# Patient Record
Sex: Male | Born: 1995 | Race: White | Hispanic: No | Marital: Single | Smoking: Current every day smoker
Health system: Southern US, Community
[De-identification: ages and names within clinical notes are randomized; demographics above are authoritative.]

## PROBLEM LIST (undated history)

## (undated) ENCOUNTER — Emergency Department: Discharge status: Absent without leave | Payer: No Typology Code available for payment source

## (undated) DIAGNOSIS — F909 Attention-deficit hyperactivity disorder, unspecified type: Secondary | ICD-10-CM

## (undated) DIAGNOSIS — B182 Chronic viral hepatitis C: Secondary | ICD-10-CM

## (undated) DIAGNOSIS — B1920 Unspecified viral hepatitis C without hepatic coma: Secondary | ICD-10-CM

## (undated) HISTORY — PX: OTHER SURGICAL HISTORY: SHX169

## (undated) HISTORY — PX: HUMERUS FRACTURE SURGERY: SHX670

---
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
## 2014-10-10 ENCOUNTER — Emergency Department: Payer: Self-pay | Admitting: Emergency Medicine

## 2014-10-20 ENCOUNTER — Emergency Department: Payer: Self-pay | Admitting: Internal Medicine

## 2014-12-02 ENCOUNTER — Emergency Department: Payer: Self-pay | Admitting: Emergency Medicine

## 2015-04-12 ENCOUNTER — Emergency Department
Admit: 2015-04-12 | Disposition: A | Discharge status: Home / Self Care | Payer: Self-pay | Admitting: Emergency Medicine

## 2015-07-09 ENCOUNTER — Emergency Department: Payer: Medicaid Other

## 2015-07-09 ENCOUNTER — Emergency Department
Admission: EM | Admit: 2015-07-09 | Discharge: 2015-07-09 | Disposition: A | Discharge status: Home / Self Care | Payer: Medicaid Other | Attending: Emergency Medicine | Admitting: Emergency Medicine

## 2015-07-09 DIAGNOSIS — Y998 Other external cause status: Secondary | ICD-10-CM | POA: Diagnosis not present

## 2015-07-09 DIAGNOSIS — M545 Low back pain, unspecified: Secondary | ICD-10-CM

## 2015-07-09 DIAGNOSIS — Y9389 Activity, other specified: Secondary | ICD-10-CM | POA: Insufficient documentation

## 2015-07-09 DIAGNOSIS — S161XXA Strain of muscle, fascia and tendon at neck level, initial encounter: Secondary | ICD-10-CM | POA: Diagnosis not present

## 2015-07-09 DIAGNOSIS — Y92410 Unspecified street and highway as the place of occurrence of the external cause: Secondary | ICD-10-CM | POA: Diagnosis not present

## 2015-07-09 DIAGNOSIS — S3992XA Unspecified injury of lower back, initial encounter: Secondary | ICD-10-CM | POA: Insufficient documentation

## 2015-07-09 DIAGNOSIS — S199XXA Unspecified injury of neck, initial encounter: Secondary | ICD-10-CM | POA: Diagnosis present

## 2015-07-09 MED ORDER — NAPROXEN 500 MG PO TABS: 500.0000 mg | ORAL_TABLET | Freq: Two times a day (BID) | ORAL | Status: AC

## 2015-07-09 MED ORDER — NAPROXEN 500 MG PO TABS
500.0000 mg | ORAL_TABLET | Freq: Once | ORAL | Status: AC
  Administered 2015-07-09: 500 mg via ORAL
  Filled 2015-07-09: qty 1

## 2015-07-09 MED ORDER — DIAZEPAM 2 MG PO TABS
2.0000 mg | ORAL_TABLET | Freq: Once | ORAL | Status: AC
  Administered 2015-07-09: 2 mg via ORAL
  Filled 2015-07-09: qty 1

## 2015-07-09 MED ORDER — DIAZEPAM 2 MG PO TABS: 2.0000 mg | ORAL_TABLET | Freq: Three times a day (TID) | ORAL | Status: DC | PRN

## 2015-07-09 NOTE — ED Notes (Signed)
Rigid c collar applied in triage 
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
## 2015-07-09 NOTE — ED Notes (Signed)
Patient with no complaints at this time. Respirations even and unlabored. Skin warm/dry. Discharge instructions reviewed with patient at this time. Patient given opportunity to voice concerns/ask questions. Patient discharged at this time and left Emergency Department with steady gait.   

## 2015-07-10 NOTE — ED Notes (Signed)
Addendum - Patient's chart accessed due to patient stating that hospital had called and notified he had an infection in his blood stream.  This RN attempting to find correct information for patient.

## 2015-07-18 ENCOUNTER — Emergency Department
Admission: EM | Admit: 2015-07-18 | Discharge: 2015-07-19 | Disposition: A | Discharge status: Other Acute Inpatient Hospital | Payer: MEDICAID | Attending: Emergency Medicine | Admitting: Emergency Medicine

## 2015-07-18 ENCOUNTER — Encounter: Payer: Self-pay | Admitting: Emergency Medicine

## 2015-07-18 DIAGNOSIS — S60812A Abrasion of left wrist, initial encounter: Secondary | ICD-10-CM | POA: Insufficient documentation

## 2015-07-18 DIAGNOSIS — F23 Brief psychotic disorder: Secondary | ICD-10-CM

## 2015-07-18 DIAGNOSIS — F1510 Other stimulant abuse, uncomplicated: Secondary | ICD-10-CM | POA: Insufficient documentation

## 2015-07-18 DIAGNOSIS — F1110 Opioid abuse, uncomplicated: Secondary | ICD-10-CM | POA: Insufficient documentation

## 2015-07-18 DIAGNOSIS — X58XXXA Exposure to other specified factors, initial encounter: Secondary | ICD-10-CM | POA: Insufficient documentation

## 2015-07-18 DIAGNOSIS — Y9389 Activity, other specified: Secondary | ICD-10-CM | POA: Insufficient documentation

## 2015-07-18 DIAGNOSIS — F1210 Cannabis abuse, uncomplicated: Secondary | ICD-10-CM | POA: Insufficient documentation

## 2015-07-18 DIAGNOSIS — F29 Unspecified psychosis not due to a substance or known physiological condition: Secondary | ICD-10-CM | POA: Insufficient documentation

## 2015-07-18 DIAGNOSIS — Y9289 Other specified places as the place of occurrence of the external cause: Secondary | ICD-10-CM | POA: Insufficient documentation

## 2015-07-18 DIAGNOSIS — F1410 Cocaine abuse, uncomplicated: Secondary | ICD-10-CM | POA: Insufficient documentation

## 2015-07-18 DIAGNOSIS — F1910 Other psychoactive substance abuse, uncomplicated: Secondary | ICD-10-CM

## 2015-07-18 DIAGNOSIS — Y998 Other external cause status: Secondary | ICD-10-CM | POA: Insufficient documentation

## 2015-07-18 DIAGNOSIS — G8929 Other chronic pain: Secondary | ICD-10-CM

## 2015-07-18 HISTORY — DX: Attention-deficit hyperactivity disorder, unspecified type: F90.9

## 2015-07-18 LAB — URINE DRUG SCREEN, QUALITATIVE (ARMC ONLY)
Amphetamines, Ur Screen: NOT DETECTED
BENZODIAZEPINE, UR SCRN: NOT DETECTED
Barbiturates, Ur Screen: NOT DETECTED
CANNABINOID 50 NG, UR ~~LOC~~: POSITIVE — AB
Cocaine Metabolite,Ur ~~LOC~~: POSITIVE — AB
MDMA (ECSTASY) UR SCREEN: NOT DETECTED
METHADONE SCREEN, URINE: NOT DETECTED
OPIATE, UR SCREEN: POSITIVE — AB
PHENCYCLIDINE (PCP) UR S: NOT DETECTED
Tricyclic, Ur Screen: POSITIVE — AB

## 2015-07-18 LAB — CBC
HCT: 39.7 % — ABNORMAL LOW (ref 40.0–52.0)
HEMOGLOBIN: 13.5 g/dL (ref 13.0–18.0)
MCH: 30.5 pg (ref 26.0–34.0)
MCHC: 34 g/dL (ref 32.0–36.0)
MCV: 89.6 fL (ref 80.0–100.0)
PLATELETS: 182 10*3/uL (ref 150–440)
RBC: 4.43 MIL/uL (ref 4.40–5.90)
RDW: 13.2 % (ref 11.5–14.5)
WBC: 12.4 10*3/uL — ABNORMAL HIGH (ref 3.8–10.6)

## 2015-07-18 LAB — COMPREHENSIVE METABOLIC PANEL
ALK PHOS: 67 U/L (ref 38–126)
ALT: 17 U/L (ref 17–63)
AST: 22 U/L (ref 15–41)
Albumin: 4.4 g/dL (ref 3.5–5.0)
Anion gap: 8 (ref 5–15)
BILIRUBIN TOTAL: 0.8 mg/dL (ref 0.3–1.2)
BUN: 10 mg/dL (ref 6–20)
CALCIUM: 9.1 mg/dL (ref 8.9–10.3)
CHLORIDE: 107 mmol/L (ref 101–111)
CO2: 26 mmol/L (ref 22–32)
Creatinine, Ser: 1.05 mg/dL (ref 0.61–1.24)
GFR calc Af Amer: >60 mL/min (ref >60)
Glucose, Bld: 86 mg/dL (ref 65–99)
POTASSIUM: 4.2 mmol/L (ref 3.5–5.1)
SODIUM: 141 mmol/L (ref 135–145)
Total Protein: 6.8 g/dL (ref 6.5–8.1)

## 2015-07-18 LAB — ETHANOL

## 2015-07-18 LAB — SALICYLATE LEVEL: Salicylate Lvl: <4 mg/dL (ref 2.8–30.0)

## 2015-07-18 LAB — ACETAMINOPHEN LEVEL

## 2015-07-18 NOTE — ED Provider Notes (Addendum)
[facility] Emergency Department Provider Note     Time seen: ----------------------------------------- 6:31 PM on 07/18/2015 -----------------------------------------    I have reviewed the triage vital signs and the nursing notes. Level V caveat: Review of systems and history can't be obtained due to bizarre behavior.  HISTORY  Chief Complaint Psychiatric Evaluation    HPI Oscar Munoz is a 19 y.o. male who presents the ER being brought by in voluntary commitment for bizarre and psychotic behavior from the family. Patient is non-communicative at this point, review review of systems and history can't be obtained from patient.According to IVC. Report patient lives with his and several small children. He's been doing illegal drugs, has been eating at the trashcan, not sleeping and is not bathing. The report is that is running and hiding under the porch because he thinks someone is after him, talking to people who were not there.   No past medical history on file.  There are no active problems to display for this patient.   No past surgical history on file.  Allergies Review of patient's allergies indicates not on file.  Social History History  Substance Use Topics  . Smoking status: Not on file  . Smokeless tobacco: Not on file  . Alcohol Use: Not on file    Review of Systems 10-point ROS otherwise negative.  ____________________________________________   PHYSICAL EXAM:  VITAL SIGNS: ED Triage Vitals  Enc Vitals Group     BP 07/18/15 1644 115/61 mmHg     Pulse Rate 07/18/15 1644 76     Resp 07/18/15 1644 18     Temp 07/18/15 1644 98.5 F (36.9 C)     Temp Source 07/18/15 1644 Oral     SpO2 07/18/15 1644 99 %     Weight 07/18/15 1644 155 lb (70.308 kg)     Height 07/18/15 1644 6\' 3"  (1.905 m)     Head Cir --      Peak Flow --      Pain Score --      Pain Loc --      Pain Edu? --      Excl. in GC? --     Constitutional: Alert  , no acute distress Eyes: Conjunctivae are normal. ENT   Head: Normocephalic and atraumatic.   Nose: No congestion/rhinnorhea.   Mouth/Throat: Mucous membranes are moist.   Neck: No stridor. Cardiovascular: Normal rate, regular rhythm.  Respiratory: Normal respiratory effort without tachypnea nor retractions. Gastrointestinal: Soft and nontender. No distention.  Musculoskeletal: Nontender with normal range of motion in all extremities.  Neurologic:  No gross focal neurologic deficits are appreciated. No gait instability. Skin:  There is an abrasion noted to his left wrist, superficial laceration as well Psychiatric: Patient will not respond to questioning.  ____________________________________________  ED COURSE:  Pertinent labs & imaging results that were available during my care of the patient were reviewed by me and considered in my medical decision making (see chart for details). Patient likely with acute psychosis, we'll IVC. ____________________________________________    LABS (pertinent positives/negatives)  Labs Reviewed  ACETAMINOPHEN LEVEL - Abnormal; Notable for the following:    Acetaminophen (Tylenol), Serum <10 (*)    All other components within normal limits  CBC - Abnormal; Notable for the following:    WBC 12.4 (*)    HCT 39.7 (*)    All other components within normal limits  URINE DRUG SCREEN, QUALITATIVE ([facility] ONLY) - Abnormal; Notable for the following:  Tricyclic, Ur Screen POSITIVE (*)    Cocaine Metabolite,Ur [place] POSITIVE (*)    Opiate, Ur Screen POSITIVE (*)    Cannabinoid 50 Ng, Ur [place] POSITIVE (*)    All other components within normal limits  COMPREHENSIVE METABOLIC PANEL  ETHANOL  SALICYLATE LEVEL   ____________________________________________  FINAL ASSESSMENT AND PLAN  Acute psychosis, polysubstance abuse  Plan: Patient with labs and imaging as dictated above. Will continue under involuntary commitment, will consult  psychiatry for admission. Currently his vital signs are stable, labs reveal polysubstance abuse as well   Emily Filbert, MD   Emily Filbert, MD 07/18/15 1835  Emily Filbert, MD 07/18/15 [phone]

## 2015-07-18 NOTE — ED Notes (Signed)
BEHAVIORAL HEALTH ROUNDING Patient sleeping: No. Patient alert and oriented: yes Behavior appropriate: Yes.  ; If no, describe:  Nutrition and fluids offered: Yes  Toileting and hygiene offered: Yes  Sitter present: yes Law enforcement present: Yes  
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
## 2015-07-18 NOTE — ED Notes (Signed)
Patient to ED via [facility] under IVC with report of bizarre/psychotic type behaviors from family.

## 2015-07-18 NOTE — ED Notes (Addendum)
BEHAVIORAL HEALTH ROUNDING Patient sleeping: yes Patient alert and oriented: n/a Behavior appropriate: Yes.  ; If no, describe:  Nutrition and fluids offered: Yes  Toileting and hygiene offered: Yes  Sitter present: yes Law enforcement present: Yes

## 2015-07-18 NOTE — ED Notes (Signed)

## 2015-07-19 ENCOUNTER — Inpatient Hospital Stay
Admission: EM | Admit: 2015-07-19 | Discharge: 2015-07-22 | DRG: 897 | Disposition: A | Discharge status: Other Acute Inpatient Hospital | Payer: MEDICAID | Attending: Psychiatry | Admitting: Psychiatry

## 2015-07-19 DIAGNOSIS — R443 Hallucinations, unspecified: Secondary | ICD-10-CM | POA: Diagnosis present

## 2015-07-19 DIAGNOSIS — G8929 Other chronic pain: Secondary | ICD-10-CM | POA: Diagnosis present

## 2015-07-19 DIAGNOSIS — R45851 Suicidal ideations: Secondary | ICD-10-CM | POA: Diagnosis present

## 2015-07-19 DIAGNOSIS — F22 Delusional disorders: Secondary | ICD-10-CM | POA: Diagnosis present

## 2015-07-19 DIAGNOSIS — F419 Anxiety disorder, unspecified: Secondary | ICD-10-CM | POA: Diagnosis present

## 2015-07-19 DIAGNOSIS — G4700 Insomnia, unspecified: Secondary | ICD-10-CM | POA: Diagnosis present

## 2015-07-19 DIAGNOSIS — F19959 Other psychoactive substance use, unspecified with psychoactive substance-induced psychotic disorder, unspecified: Secondary | ICD-10-CM | POA: Diagnosis present

## 2015-07-19 DIAGNOSIS — F1220 Cannabis dependence, uncomplicated: Secondary | ICD-10-CM | POA: Diagnosis present

## 2015-07-19 DIAGNOSIS — F29 Unspecified psychosis not due to a substance or known physiological condition: Secondary | ICD-10-CM

## 2015-07-19 DIAGNOSIS — Z981 Arthrodesis status: Secondary | ICD-10-CM

## 2015-07-19 DIAGNOSIS — F1123 Opioid dependence with withdrawal: Secondary | ICD-10-CM | POA: Diagnosis present

## 2015-07-19 DIAGNOSIS — F1920 Other psychoactive substance dependence, uncomplicated: Secondary | ICD-10-CM

## 2015-07-19 DIAGNOSIS — F17200 Nicotine dependence, unspecified, uncomplicated: Secondary | ICD-10-CM | POA: Diagnosis present

## 2015-07-19 DIAGNOSIS — Z79899 Other long term (current) drug therapy: Secondary | ICD-10-CM

## 2015-07-19 DIAGNOSIS — F1120 Opioid dependence, uncomplicated: Secondary | ICD-10-CM | POA: Diagnosis present

## 2015-07-19 DIAGNOSIS — F19951 Other psychoactive substance use, unspecified with psychoactive substance-induced psychotic disorder with hallucinations: Secondary | ICD-10-CM | POA: Diagnosis not present

## 2015-07-19 DIAGNOSIS — F1420 Cocaine dependence, uncomplicated: Secondary | ICD-10-CM | POA: Diagnosis present

## 2015-07-19 DIAGNOSIS — F17210 Nicotine dependence, cigarettes, uncomplicated: Secondary | ICD-10-CM | POA: Diagnosis present

## 2015-07-19 DIAGNOSIS — Z818 Family history of other mental and behavioral disorders: Secondary | ICD-10-CM

## 2015-07-19 LAB — LIPID PANEL
Cholesterol: 96 mg/dL (ref 0–200)
HDL: 40 mg/dL — AB (ref >40)
LDL Cholesterol: 38 mg/dL (ref 0–99)
Total CHOL/HDL Ratio: 2.4 RATIO
Triglycerides: 88 mg/dL (ref <150)
VLDL: 18 mg/dL (ref 0–40)

## 2015-07-19 LAB — TSH: TSH: 0.315 u[IU]/mL — ABNORMAL LOW (ref 0.350–4.500)

## 2015-07-19 MED ORDER — MAGNESIUM HYDROXIDE 400 MG/5ML PO SUSP: 30.0000 mL | Freq: Every day | ORAL | Status: DC | PRN

## 2015-07-19 MED ORDER — BUPRENORPHINE HCL 2 MG SL SUBL
4.0000 mg | SUBLINGUAL_TABLET | Freq: Two times a day (BID) | SUBLINGUAL | Status: AC
  Administered 2015-07-19 – 2015-07-20 (×3): 4 mg via SUBLINGUAL
  Filled 2015-07-19 (×4): qty 2

## 2015-07-19 MED ORDER — ALUM & MAG HYDROXIDE-SIMETH 200-200-20 MG/5ML PO SUSP
30.0000 mL | ORAL | Status: DC | PRN
Start: 2015-07-19 — End: 2015-07-22

## 2015-07-19 MED ORDER — BUPRENORPHINE HCL 2 MG SL SUBL
4.0000 mg | SUBLINGUAL_TABLET | SUBLINGUAL | Status: AC
  Administered 2015-07-19: 4 mg via SUBLINGUAL
  Filled 2015-07-19: qty 2

## 2015-07-19 MED ORDER — NICOTINE 10 MG IN INHA
RESPIRATORY_TRACT | Status: AC
  Filled 2015-07-19: qty 36

## 2015-07-19 MED ORDER — NICOTINE 10 MG IN INHA
1.0000 | RESPIRATORY_TRACT | Status: DC | PRN
  Administered 2015-07-19: 1 via RESPIRATORY_TRACT

## 2015-07-19 MED ORDER — ACETAMINOPHEN 325 MG PO TABS
650.0000 mg | ORAL_TABLET | Freq: Four times a day (QID) | ORAL | Status: DC | PRN
  Administered 2015-07-22: 650 mg via ORAL
  Filled 2015-07-19: qty 2

## 2015-07-19 NOTE — ED Notes (Signed)
Patient report given to Amy, RN, patient going to room 313. Awaiting TTS.

## 2015-07-19 NOTE — Tx Team (Signed)
Initial Interdisciplinary Treatment Plan   PATIENT STRESSORS: Financial difficulties Substance abuse   PATIENT STRENGTHS: Capable of independent living Supportive family/friends   PROBLEM LIST: Problem List/Patient Goals Date to be addressed Date deferred Reason deferred Estimated date of resolution  Substance abuse      hallucinations                                                 DISCHARGE CRITERIA:  Verbal commitment to aftercare and medication compliance Withdrawal symptoms are absent or subacute and managed without 24-hour nursing intervention  PRELIMINARY DISCHARGE PLAN: Attend 12-step recovery group Return to previous living arrangement  PATIENT/FAMIILY INVOLVEMENT: This treatment plan has been presented to and reviewed with the patient, Oscar Munoz, and/or family member. The patient and family have been given the opportunity to ask questions and make suggestions.  Oscar Munoz [name] 07/19/2015, 1:26 PM

## 2015-07-19 NOTE — Progress Notes (Signed)
Recreation Therapy Notes  Date: 07.25.16 Time: 3:00 pm Location: Craft Room  Group Topic: Self-expression  Goal Area(s) Addresses:  Patient will effectively use art as a means of self-expression. Patient will recognize positive benefit of self-expression. Patient will be able to identify one emotion experienced during group session. Patient will identify use of art/self-expression as a coping skill.  Behavioral Response: Attentive  Intervention: Two Faces of Me  Activity: Patients were given a blank face worksheet and instructed to draw a line down the middle. On one side they were instructed to draw or write how they felt when they were admitted to the hospital. On the other side they were instructed to draw or write how they want to feel when they are d/c from the hospital.  Education: LRT educated patients on different forms of self-expression.  Education Outcome: In group clarification offered  Clinical Observations/Feedback: Patient drew two faces. Patient did not contribute to group discussion.  Jacquelynn Cree, LRT/CTRS 07/19/2015 4:24 PM

## 2015-07-19 NOTE — ED Notes (Signed)
ED BHU PLACEMENT JUSTIFICATION Is the patient under IVC or is there intent for IVC: Yes.   Is the patient medically cleared: Yes.   Is there vacancy in the ED BHU: Yes.   Is the population mix appropriate for patient: Yes.   Is the patient awaiting placement in inpatient or outpatient setting: No. Has the patient had a psychiatric consult: No. Survey of unit performed for contraband, proper placement and condition of furniture, tampering with fixtures in bathroom, shower, and each patient room: Yes.  ; Findings:  APPEARANCE/BEHAVIOR adequate rapport can be established NEURO ASSESSMENT Orientation: person Hallucinations: No.None noted (Hallucinations) Speech: Normal Gait: normal RESPIRATORY ASSESSMENT Breathing Pattern: Normal CARDIOVASCULAR ASSESSMENT regular rate and rhythm, S1, S2 normal, no murmur, click, rub or gallop and No complications noted GASTROINTESTINAL ASSESSMENT No complaints reported EXTREMITIES no deformities PLAN OF CARE Provide calm/safe environment. Vital signs assessed twice daily. ED BHU Assessment once each 12-hour shift. Collaborate with intake RN daily or as condition indicates. Assure the ED provider has rounded once each shift. Provide and encourage hygiene. Provide redirection as needed. Assess for escalating behavior; address immediately and inform ED provider.  Assess family dynamic and appropriateness for visitation as needed: Yes.  ; If necessary, describe findings:  Educate the patient/family about BHU procedures/visitation: Yes.  ; If necessary, describe findings:

## 2015-07-19 NOTE — ED Notes (Signed)

## 2015-07-19 NOTE — ED Notes (Signed)
BEHAVIORAL HEALTH ROUNDING Patient sleeping: Yes.   Patient alert and oriented: not applicable Behavior appropriate: Yes.    Nutrition and fluids offered: No Toileting and hygiene offered: No Sitter present: q15 minute observations and security camera monitoring Law enforcement present: Yes [employer] 

## 2015-07-19 NOTE — BHH Group Notes (Signed)
[facility] LCSW Group Therapy  07/19/2015 3:46 PM  Type of Therapy:  Group Therapy  Participation Level:  Did Not Attend   Lulu Riding, MSW, LCSWA 07/19/2015, 3:46 PM
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
## 2015-07-19 NOTE — ED Notes (Signed)
ENVIRONMENTAL ASSESSMENT  Potentially harmful objects out of patient reach: Yes.  Personal belongings secured: Yes.  Patient dressed in hospital provided attire only: Yes.  Plastic bags out of patient reach: Yes.  Patient care equipment (cords, cables, call bells, lines, and drains) shortened, removed, or accounted for: Yes.  Equipment and supplies removed from bottom of stretcher: Yes.  Potentially toxic materials out of patient reach: Yes.  Sharps container removed or out of patient reach: Yes.   BEHAVIORAL HEALTH ROUNDING Patient sleeping: Yes.   Patient alert and oriented: not applicable SLEEPING Behavior appropriate: Yes.  ; If no, describe: SLEEPING Nutrition and fluids offered: No SLEEPING Toileting and hygiene offered: NoSLEEPING Sitter present: not applicable Law enforcement present: Yes [employer] 

## 2015-07-19 NOTE — ED Provider Notes (Signed)
-----------------------------------------   7:24 AM on 07/19/2015 -----------------------------------------   BP 101/52 mmHg  Pulse 61  Temp(Src) 97.4 F (36.3 C) (Oral)  Resp 18  Ht  (1.905 m)  Wt 155 lb (70.308 kg)  BMI 19.37 kg/m2  SpO2 99%  No acute events overnight. Vitals reviewed. Patient remains medically cleared.  Disposition is pending per Psychiatry/Behavioral Medicine team recommendations.   Jene Every, MD 07/19/15 [phone]

## 2015-07-19 NOTE — BHH Counselor (Signed)
Per Dr. Kumar, pt meets criteria for inpatient admission. 

## 2015-07-19 NOTE — ED Notes (Signed)
BEHAVIORAL HEALTH ROUNDING Patient sleeping: No. Patient alert and oriented: yes Behavior appropriate: Yes.  ; If no, describe:  Nutrition and fluids offered: Yes  Toileting and hygiene offered: Yes  Sitter present: yes 15 minute safety rounds completed Law enforcement present: Yes [employer]  

## 2015-07-19 NOTE — ED Notes (Signed)
BEHAVIORAL HEALTH ROUNDING Patient sleeping: Yes.   Patient alert and oriented: not applicable SLEEPING Behavior appropriate: Yes.  ; If no, describe: SLEEPING Nutrition and fluids offered: No SLEEPING Toileting and hygiene offered: NoSLEEPING Sitter present: not applicable Law enforcement present: Yes [employer] 

## 2015-07-19 NOTE — ED Notes (Signed)
BEHAVIORAL HEALTH ROUNDING Patient sleeping: Yes.   Patient alert and oriented: not applicable Behavior appropriate: Yes.  ; If no, describe:  Nutrition and fluids offered: Yes  Toileting and hygiene offered: Yes  Sitter present: yes 15 minute rounds being completed Law enforcement present: Yes [employer] 
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
## 2015-07-19 NOTE — ED Notes (Signed)
Patient assigned to appropriate care area. Patient oriented to unit/care area: Informed that, for their safety, care areas are designed for safety and monitored by security cameras at all times; and visiting hours explained to patient. Patient verbalizes understanding, and verbal contract for safety obtained.  Pt brought into ED BHU via sally port escorted by myself and [employer] Officer. Pt wanded with metal detector for safety by [employer] officer. Patient oriented to unit/care area: Pt informed of unit policies and procedures.  Informed that, for their safety, care areas are designed for safety and monitored by security cameras at all times; and visiting hours explained to patient. Patient verbalizes understanding, and verbal contract for safety obtained.Pt shown to their room 7.  BEHAVIORAL HEALTH ROUNDING Patient sleeping: No. Patient alert and oriented: yes Behavior appropriate: Yes.   Nutrition and fluids offered: Yes  Toileting and hygiene offered: Yes  Sitter present: q15 min observations and security camera monitoring Law enforcement present: Yes [employer]  ENVIRONMENTAL ASSESSMENT Potentially harmful objects out of patient reach: Yes.   Personal belongings secured: Yes.   Patient dressed in hospital provided attire only: Yes.   Plastic bags out of patient reach: Yes.   Patient care equipment removed: Yes.   Equipment and supplies removed: Yes.   Potentially toxic materials out of patient reach: Yes.   Sharps container removed or out of patient reach: Yes.     ED BHU PLACEMENT JUSTIFICATION Is the patient under IVC or is there intent for IVC: Yes.    Is IVC current? Yes Is the patient medically cleared: Yes.   Is there vacancy in the ED BHU: Yes.   Is the population mix appropriate for patient: Yes.   Is the patient awaiting placement in inpatient or outpatient setting: unknown at this time pt refused to talk to TTS since his admission.   Has the patient had a psychiatric consult: No.    Survey of unit performed for contraband, proper placement and condition of furniture, tampering with fixtures in bathroom, shower, and each patient room: Yes.  ; Findings: none APPEARANCE/BEHAVIOR Cooperative,calm NEURO ASSESSMENT Orientation: Oriented to person and place Hallucinations: none noted at this time Speech: Normal Gait: normal RESPIRATORY ASSESSMENT Breathing Pattern-regular, no respiratory distress noted CARDIOVASCULAR ASSESSMENT Skin color appropriate for age and race GASTROINTESTINAL ASSESSMENT no GI distress noted EXTREMITIES Moves all extremities, no distress noted PLAN OF CARE Provide calm/safe environment. Vital signs assessed twice daily. ED BHU Assessment once each 12-hour shift. Collaborate with intake RN daily or as condition indicates. Assure the ED provider has rounded once each shift. Provide and encourage hygiene. Provide redirection as needed. Assess for escalating behavior; address immediately and inform ED provider.  Assess family dynamic and appropriateness for visitation as needed: Yes.  Educate the patient/family about BHU procedures/visitation: Yes.

## 2015-07-20 ENCOUNTER — Encounter: Payer: Self-pay | Admitting: Psychiatry

## 2015-07-20 DIAGNOSIS — F1220 Cannabis dependence, uncomplicated: Secondary | ICD-10-CM | POA: Diagnosis present

## 2015-07-20 DIAGNOSIS — F17200 Nicotine dependence, unspecified, uncomplicated: Secondary | ICD-10-CM | POA: Diagnosis present

## 2015-07-20 DIAGNOSIS — F1120 Opioid dependence, uncomplicated: Secondary | ICD-10-CM | POA: Diagnosis present

## 2015-07-20 DIAGNOSIS — F1420 Cocaine dependence, uncomplicated: Secondary | ICD-10-CM | POA: Diagnosis present

## 2015-07-20 DIAGNOSIS — F19951 Other psychoactive substance use, unspecified with psychoactive substance-induced psychotic disorder with hallucinations: Secondary | ICD-10-CM

## 2015-07-20 LAB — HEMOGLOBIN A1C: HEMOGLOBIN A1C: 5 % (ref 4.0–6.0)

## 2015-07-20 MED ORDER — IBUPROFEN 800 MG PO TABS
800.0000 mg | ORAL_TABLET | Freq: Four times a day (QID) | ORAL | Status: DC | PRN
  Administered 2015-07-21: 800 mg via ORAL
  Filled 2015-07-20: qty 1

## 2015-07-20 MED ORDER — NICOTINE 10 MG IN INHA: 1.0000 | RESPIRATORY_TRACT | Status: DC | PRN

## 2015-07-20 MED ORDER — QUETIAPINE FUMARATE 100 MG PO TABS
100.0000 mg | ORAL_TABLET | Freq: Every day | ORAL | Status: DC
  Administered 2015-07-20 – 2015-07-21 (×2): 100 mg via ORAL
  Filled 2015-07-20 (×3): qty 1

## 2015-07-20 MED ORDER — TRAZODONE HCL 100 MG PO TABS
100.0000 mg | ORAL_TABLET | Freq: Every evening | ORAL | Status: DC | PRN
Start: 2015-07-20 — End: 2015-07-22
  Administered 2015-07-20 – 2015-07-21 (×2): 100 mg via ORAL
  Filled 2015-07-20 (×3): qty 1

## 2015-07-20 MED ORDER — QUETIAPINE FUMARATE 25 MG PO TABS
25.0000 mg | ORAL_TABLET | Freq: Three times a day (TID) | ORAL | Status: DC
  Administered 2015-07-20 – 2015-07-22 (×6): 25 mg via ORAL
  Filled 2015-07-20 (×7): qty 1

## 2015-07-20 MED ORDER — TRAZODONE HCL 100 MG PO TABS: 100.0000 mg | ORAL_TABLET | Freq: Every evening | ORAL | Status: DC | PRN

## 2015-07-20 MED ORDER — LOPERAMIDE HCL 2 MG PO CAPS
4.0000 mg | ORAL_CAPSULE | ORAL | Status: DC | PRN
  Administered 2015-07-22: 4 mg via ORAL
  Filled 2015-07-20 (×2): qty 2

## 2015-07-20 NOTE — Progress Notes (Signed)
D: Patient denies SI/HI/AVH. Patient affect and mood is  anxious.  Patient states he stays up for days on a regular basis. Patient did attend some groups. Patient visible on the milieu. No distress noted. A: Support and encouragement offered. Scheduled medications given to pt. Q 15 min checks continued for patient safety. R: Patient receptive. Patient remains safe on the unit.

## 2015-07-20 NOTE — Progress Notes (Signed)
Recreation Therapy Notes  Date: 07.26.16 Time: 3:00 pm Location: Craft Room  Group Topic: Coping Skills  Goal Area(s) Addresses:  Patient will verbalize an emotion experienced during group. Patient will verbalize benefit of using art as a coping skill.  Behavioral Response: Did not attend  Intervention: Coloring  Activity: Patients were given coloring sheets and instructed to color.   Education: LRT educated patients on benefit of using art as a Associate Professor.  Education Outcome: Patient did not attend group.  Clinical Observations/Feedback: Patient did not attend group.  Jacquelynn Cree, LRT/CTRS 07/20/2015 4:25 PM

## 2015-07-20 NOTE — Progress Notes (Signed)
Recreation Therapy Notes  INPATIENT RECREATION THERAPY ASSESSMENT  Patient Details Name: Shondale Quinley MRN: [id number] DOB: 01-13-1996 Today's Date: 07/20/2015  Patient Stressors: Family, Death  Coping Skills:   Arguments, Substance Abuse, Exercise, Talking, Music, Sports  Personal Challenges: Substance Abuse  Leisure Interests (2+):  Nature - Fishing, Individual - Other (Comment) (Hanging out with his girl, going out to clubs)  Awareness of Community Resources:  Yes  Community Resources:  Research scientist (physical sciences), Tree surgeon  Current Use: Yes  If no, Barriers?:    Patient Strengths:  Personality, loving  Patient Identified Areas of Improvement:  Spending money on drugs  Current Recreation Participation:  Working on cars  Patient Goal for Hospitalization:  To get out, get an anxiety medication, and stop using drugs  [place] of Residence:  [place] of Residence:  [place]   Current SI (including self-harm):  No  Current HI:  No  Consent to Intern Participation: N/A   Jacquelynn Cree, LRT/CTRS 07/20/2015, 12:50 PM

## 2015-07-20 NOTE — Plan of Care (Signed)
Problem: Ineffective individual coping Goal: STG: Pt will be able to identify effective and ineffective STG: Pt will be able to identify effective and ineffective coping patterns  Outcome: Not Progressing Pt unable to identify effective coping patterns. Goal: STG: Patient will remain free from self harm Outcome: Progressing No self harm reported or observed

## 2015-07-20 NOTE — [facility] Group Notes (Signed)
[facility] Group Notes:  (Nursing/MHT/Case Management/Adjunct)  Date:  07/20/2015  Time:  2:17 PM  Type of Therapy:  Psychoeducational Skills  Participation Level:  Did Not Attend      Oscar Munoz Oscar Munoz Arnelle Nale 07/20/2015, 2:17 PM 
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
## 2015-07-20 NOTE — Progress Notes (Signed)
D: Patient denies SI/HI/AVH.  Patient affect and mood are anxious.  Patient up for the majority of the night.  Patient states he stays up for days on a regular basis.  Patient did attend evening group. Patient visible on the milieu. No distress noted. A: Support and encouragement offered. Scheduled medications given to pt. Q 15 min checks continued for patient safety. R: Patient receptive. Patient remains safe on the unit.
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
## 2015-07-21 MED ORDER — BUPRENORPHINE HCL 2 MG SL SUBL
4.0000 mg | SUBLINGUAL_TABLET | Freq: Once | SUBLINGUAL | Status: AC
  Administered 2015-07-21: 4 mg via SUBLINGUAL
  Filled 2015-07-21: qty 2

## 2015-07-21 MED ORDER — NICOTINE 21 MG/24HR TD PT24
21.0000 mg | MEDICATED_PATCH | Freq: Every day | TRANSDERMAL | Status: DC
  Administered 2015-07-21 – 2015-07-22 (×2): 21 mg via TRANSDERMAL
  Filled 2015-07-21 (×2): qty 1

## 2015-07-21 NOTE — Progress Notes (Signed)
NUTRITION NOTE:   Oscar Munoz is a 19 y.o. male with Substance-induced psychotic disorder with hallucinations; using heroine, methadone, cocaine, marijuana  PMH:  Past Medical History  Diagnosis Date  . Attention deficit hyperactivity disorder (ADHD)     Diet Order:  Regular   Current Nutrition: pt eating 80-100% of meals  Anthropometrics:  Body mass index is 18.99 kg/(m^2).    Pt is at no nutrition risk due to diagnosis/current problem, BMI as above, eating >75% of meals trays, Regular diet order. If pt not receiving enough on meal trays, could add double portions but otherwise no nutrition intervention warranted at this time. Will sign off. Please re-consult RD if nutritional issues arise.   Romelle Starcher MS, RD, LDN [phone] Pager

## 2015-07-21 NOTE — Plan of Care (Signed)
Problem: Consults Goal: [facility] General Treatment Patient Education Outcome: Progressing Patient with sad affect. Cooperative with meds. Encouraged to attend therapy groups to learn and initiate coping skills for management of stressors and diagnosis.

## 2015-07-21 NOTE — [facility] Group Notes (Signed)
[facility] Group Notes:  (Nursing/MHT/Case Management/Adjunct)  Date:  07/21/2015  Time:  12:50 PM  Type of Therapy:  Psychoeducational Skills  Participation Level:  Did Not Attend   Oscar Munoz Oscar Munoz 07/21/2015, 12:50 PM 

## 2015-07-21 NOTE — Clinical Social Work Note (Signed)
CSW received call from [facility] and patient has bed available on 07/22/15 Thursday and will be sent IVC by sheriff.

## 2015-07-21 NOTE — Tx Team (Signed)
Interdisciplinary Treatment Plan Update (Adult)  Date:  07/21/2015 Time Reviewed:  1:33 PM  Progress in Treatment: Attending groups: No. Participating in groups:  No. Taking medication as prescribed:  Yes. Tolerating medication:  Yes. Family/Significant othe contact made:  No, will contact:  if patient provides consent Patient understands diagnosis:  Yes. Discussing patient identified problems/goals with staff:  Yes. Medical problems stabilized or resolved:  Yes. Denies suicidal/homicidal ideation: Yes. Issues/concerns per patient self-inventory:  No. Other:  New problem(s) identified: No, Describe:  none reported  Discharge Plan or Barriers: Patient is admitted for substance induced psychosis with IV heroinn use and referred IVC to [facility]. Will need community mental health and [facility] in [place].  Reason for Continuation of Hospitalization: Delusions  Other; describe paranoid pychosis  Comments:  Estimated length of stay: up to 2 days  New goal(s):  Review of initial/current patient goals per problem list:   See Care Plan  Attendees: Physician:  Kristine Linea, MD 7/27/20161:33 PM  Nursing:   Shelia Media, RN 7/27/20161:33 PM  Other:  Beryl Meager, LCSWA 7/27/20161:33 PM  Other:  Jake Shark, LCSW 7/27/20161:33 PM  Other:  Hershal Coria, LRT 7/27/20161:33 PM  Other:  7/27/20161:33 PM  Other:  7/27/20161:33 PM  Other:  7/27/20161:33 PM  Other:  7/27/20161:33 PM  Other:  7/27/20161:33 PM  Other:  7/27/20161:33 PM  Other:   7/27/20161:33 PM   Scribe for Treatment Team:   Lulu Riding, MSW, LCSWA  07/21/2015, 1:33 PM

## 2015-07-21 NOTE — Plan of Care (Signed)
Problem: [facility] Participation in Recreation Therapeutic Interventions Goal: STG-Patient will identify at least five coping skills for ** STG: Coping Skills - Within 4 treatment sessions, patient will verbalize at least 5 coping skills for substance abuse in each of 2 treatment sessions to decrease substance abuse post d/c.  Outcome: Not Progressing Treatment Session 1; Completed 0 out of 2: At approximately 12:30 pm, LRT met with patient in craft room. Patient refused treatment session. Patient requested for LRT to attempt treatment session tomorrow as patient had new medications and is sleepy. LRT will attempt treatment session tomorrow.  Leonette Monarch, LRT/CTRS 07.27.16 1:53 pm
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
## 2015-07-21 NOTE — Progress Notes (Signed)
D: Pt is awake and active in the milieu this evening. Pt mood is anxious and his affect is sad. Pt reports that he is feeling better, and hopes that he will be able to sleep better tonight. Apparently, he often stays up for days on end using drugs, although he is "productive" when doing so. Pt seems to have very little insight at this time.  A: Writer provided emotional support and administered mediciations as prescribed.  R: Pt is appropriate on the unit and is pleasant and cooperative with staff. Pt went to bed shortly after evening snack.
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
## 2015-07-21 NOTE — [facility] Group Notes (Signed)
[facility] LCSW Group Therapy  07/21/2015 4:20 PM  Type of Therapy:  Group Therapy  Participation Level:  Did Not Attend  Modes of Intervention:  Discussion, Education, Socialization and Support  Summary of Progress/Problems: Emotional Regulation: Patients will identify both negative and positive emotions. They will discuss emotions they have difficulty regulating and how they impact their lives. Patients will be asked to identify healthy coping skills to combat unhealthy reactions to negative emotions.     Enyah Moman L Corona Popovich MSW, LCSWA  07/21/2015, 4:20 PM  

## 2015-07-21 NOTE — Progress Notes (Signed)
Recreation Therapy Notes  Date: 07.27.16 Time: 3:00 pm Location: Craft Room  Group Topic: Self-esteem  Goal Area(s) Addresses:  Patient will identify positive attributes about self. Patient will identify at least one coping skill.  Behavioral Response: Did not attend  Intervention: All About Me  Activity: Patients were instructed to make an "All About Me" pamphlet with their life's motto, positive traits, coping skills, and their healthy support system.  Education: LRT educated patients on ways to increase their self-esteem.   Education Outcome: Patient did not attend group.  Clinical Observations/Feedback: Patient did not attend group.  Jacquelynn Cree, LRT/CTRS 07/21/2015 4:20 PM

## 2015-07-22 MED ORDER — BUPRENORPHINE HCL 2 MG SL SUBL
2.0000 mg | SUBLINGUAL_TABLET | Freq: Once | SUBLINGUAL | Status: AC
  Administered 2015-07-22: 2 mg via SUBLINGUAL
  Filled 2015-07-22: qty 1

## 2015-07-22 NOTE — Progress Notes (Signed)
Recreation Therapy Notes  INPATIENT RECREATION TR PLAN  Patient Details Name: Oscar Munoz MRN: [id number] DOB: July 19, 1996 Today's Date: 07/22/2015  Rec Therapy Plan Is patient appropriate for Therapeutic Recreation?: Yes Treatment times per week: At least 2 times a week TR Treatment/Interventions: 1:1 session, Group participation (Comment) (Appropriate participation in daily recreation therapy tx)  Discharge Criteria Pt will be discharged from therapy if:: Discharged Treatment plan/goals/alternatives discussed and agreed upon by:: Patient/family  Discharge Summary Short term goals set: See Care Plan Short term goals met: Other (Comment) (Not applicable) Progress toward goals comments: Groups attended Which groups?: Other (Comment) (Self-expression) Reason goals not met: Patient refused to work on goal Therapeutic equipment acquired: None Reason patient discharged from therapy: Discharge from hospital Pt/family agrees with progress & goals achieved: Yes Date patient discharged from therapy: 07/22/15   Leonette Monarch, LRT/CTRS 07/22/2015, 4:39 PM

## 2015-07-22 NOTE — [facility] Group Notes (Signed)
[facility] Group Notes:  (Nursing/MHT/Case Management/Adjunct)  Date:  07/22/2015  Time:  1:52 AM  Type of Therapy:  Psychoeducational Skills  Participation Level:  Active  Participation Quality:  Appropriate, Attentive and Sharing  Affect:  Appropriate  Cognitive:  Appropriate and Oriented  Insight:  Improving  Engagement in Group:  Distracting and Engaged  Modes of Intervention:  Discussion and Exploration  Summary of Progress/Problems:  Foy Guadalajara 07/22/2015, 1:52 AM

## 2015-07-22 NOTE — Progress Notes (Signed)
[facility] H&P Discharge Summary faxed to [facility] for hospital follow-up °

## 2015-07-22 NOTE — [facility] Group Notes (Signed)
[facility] LCSW Aftercare Discharge Planning Group Note  07/22/2015 8:53 AM  Participation Quality:  Resistant  Affect:  Anxious  Cognitive:  Alert  Insight:  Developing/Improving  Engagement in Group:  Developing/Improving  Modes of Intervention:  Discussion and Education  Summary of Progress/Problems:Patient came from hallway and sat in room for group this am his goal is to Leave asap.  Arrie Senate M 07/22/2015, 8:53 AM

## 2015-07-22 NOTE — Plan of Care (Signed)
Problem: Ineffective individual coping Goal: STG: Patient will remain free from self harm Outcome: Progressing No self harm.  Goal: STG-Increase in ability to manage activities of daily living Outcome: Progressing Pt able to complete ADL's independently.

## 2015-07-22 NOTE — [facility] Suicide Risk Assessment (Signed)
[facility] Discharge Suicide Risk Assessment   Demographic Factors:  Male, Adolescent or young adult, Caucasian, Low socioeconomic status and Unemployed  Total Time spent with patient: 30 minutes  Musculoskeletal: Strength & Muscle Tone: within normal limits Gait & Station: normal Patient leans: N/A  Psychiatric Specialty Exam: Physical Exam  Nursing note and vitals reviewed.   Review of Systems  Gastrointestinal: Positive for diarrhea.  All other systems reviewed and are negative.   Blood pressure 134/57, pulse 57, temperature 98 F (36.7 C), temperature source Oral, resp. rate 20, height  (1.88 m), weight 67.132 kg (148 lb), SpO2 99 %.Body mass index is 18.99 kg/(m^2).  General Appearance: Casual  Eye Contact::  Good  Speech:  Clear and Coherent409  Volume:  Normal  Mood:  Euthymic  Affect:  Appropriate  Thought Process:  Goal Directed  Orientation:  Full (Time, Place, and Person)  Thought Content:  WDL  Suicidal Thoughts:  No  Homicidal Thoughts:  No  Memory:  Immediate;   Fair Recent;   Fair Remote;   Fair  Judgement:  Impaired  Insight:  Shallow  Psychomotor Activity:  Normal  Concentration:  Fair  Recall:  [employer] of Knowledge:Fair  Language: Fair  Akathisia:  No  Handed:  Right  AIMS (if indicated):     Assets:  Communication Skills Desire for Improvement Physical Health Social Support  Sleep:  Number of Hours: 6.5  Cognition: WNL  ADL's:  Intact   Have you used any form of tobacco in the last 30 days? (Cigarettes, Smokeless Tobacco, Cigars, and/or Pipes): Yes  Has this patient used any form of tobacco in the last 30 days? (Cigarettes, Smokeless Tobacco, Cigars, and/or Pipes) Yes, A prescription for an FDA-approved tobacco cessation medication was offered at discharge and the patient refused  Mental Status Per Nursing Assessment::   On Admission:  Self-harm behaviors  Current Mental Status by Physician: NA  Loss Factors: Financial  problems/change in socioeconomic status  Historical Factors: Family history of mental illness or substance abuse  Risk Reduction Factors:   Sense of responsibility to family, Living with another person, especially a relative and Positive social support  Continued Clinical Symptoms:  Alcohol/Substance Abuse/Dependencies  Cognitive Features That Contribute To Risk:  None    Suicide Risk:  Minimal: No identifiable suicidal ideation.  Patients presenting with no risk factors but with morbid ruminations; may be classified as minimal risk based on the severity of the depressive symptoms  Principal Problem: Substance-induced psychotic disorder with hallucinations Discharge Diagnoses:  Patient Active Problem List   Diagnosis Date Noted  . Tobacco use disorder [Z72.0] 07/20/2015  . Cocaine use disorder, moderate, dependence [F14.20] 07/20/2015  . Cannabis use disorder, moderate, dependence [F12.20] 07/20/2015  . Opioid use disorder, severe, dependence [F11.20] 07/20/2015  . Chronic pain [G89.29] 07/19/2015  . 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 Diet:  regular. Other:  keep follow up appointments.  Is patient on multiple antipsychotic therapies at discharge:  No   Has Patient had three or more failed trials of antipsychotic monotherapy by history:  No  Recommended Plan for Multiple Antipsychotic Therapies: NA    Faryal Marxen 07/22/2015, 10:23 AM

## 2015-07-22 NOTE — Plan of Care (Signed)
Problem: [facility] Participation in Recreation Therapeutic Interventions Goal: STG-Patient will identify at least five coping skills for ** STG: Coping Skills - Within 4 treatment sessions, patient will verbalize at least 5 coping skills for substance abuse in each of 2 treatment sessions to decrease substance abuse post d/c.  Outcome: Not Applicable Date Met:  03/79/55 Treatment Session 2; Completed 0 out of 2: At approximately 8:55 am, LRT attempted treatment session. Patient refused. LRT will not continue to attempt treatment session as patient is planning to d/c today.  Leonette Monarch, LRT/CTRS 07.28.16 12:55 pm

## 2015-07-22 NOTE — [facility] Suicide Risk Assessment (Signed)
[facility] INPATIENT:  Family/Significant Other Suicide Prevention Education  Suicide Prevention Education:  Education Completed; Erich Kochan (mother) [phone] has been identified by the patient as the family member/significant other with whom the patient will be residing, and identified as the person(s) who will aid the patient in the event of a mental health crisis (suicidal ideations/suicide attempt).  With written consent from the patient, the family member/significant other has been provided the following suicide prevention education, prior to the and/or following the discharge of the patient.  The suicide prevention education provided includes the following:  Suicide risk factors  Suicide prevention and interventions  National Suicide Hotline telephone number  [facility] assessment telephone number  [facility] Emergency Assistance 911  [facility] and/or Residential Mobile Crisis Unit telephone number  Request made of family/significant other to:  Remove weapons (e.g., guns, rifles, knives), all items previously/currently identified as safety concern.    Remove drugs/medications (over-the-counter, prescriptions, illicit drugs), all items previously/currently identified as a safety concern.  The family member/significant other verbalizes understanding of the suicide prevention education information provided.  The family member/significant other agrees to remove the items of safety concern listed above.  Lulu Riding, MSW, LCSWA 07/22/2015, 11:06 AM

## 2015-07-22 NOTE — Progress Notes (Signed)
Patient discharged to [facility] transported by law enforcement. He denies SI or HI. Discharge instructions reviewed with patient, he verbalizes understanding. Patient received copy of discharge instructions and all personal belongings.

## 2017-07-07 ENCOUNTER — Other Ambulatory Visit: Payer: Self-pay

## 2017-07-07 ENCOUNTER — Emergency Department (HOSPITAL_COMMUNITY): Payer: Medicaid Other

## 2017-07-07 ENCOUNTER — Emergency Department (HOSPITAL_COMMUNITY)
Admission: EM | Admit: 2017-07-07 | Discharge: 2017-07-07 | Disposition: A | Discharge status: Home / Self Care | Payer: Medicaid Other | Attending: Emergency Medicine | Admitting: Emergency Medicine

## 2017-07-07 ENCOUNTER — Encounter (HOSPITAL_COMMUNITY): Payer: Self-pay | Admitting: Emergency Medicine

## 2017-07-07 DIAGNOSIS — Z5189 Encounter for other specified aftercare: Secondary | ICD-10-CM

## 2017-07-07 DIAGNOSIS — F909 Attention-deficit hyperactivity disorder, unspecified type: Secondary | ICD-10-CM | POA: Insufficient documentation

## 2017-07-07 DIAGNOSIS — F17210 Nicotine dependence, cigarettes, uncomplicated: Secondary | ICD-10-CM | POA: Insufficient documentation

## 2017-07-07 DIAGNOSIS — F1123 Opioid dependence with withdrawal: Secondary | ICD-10-CM | POA: Insufficient documentation

## 2017-07-07 DIAGNOSIS — R0789 Other chest pain: Secondary | ICD-10-CM

## 2017-07-07 DIAGNOSIS — F1193 Opioid use, unspecified with withdrawal: Secondary | ICD-10-CM

## 2017-07-07 HISTORY — DX: Unspecified viral hepatitis C without hepatic coma: B19.20

## 2017-07-07 LAB — CBC WITH DIFFERENTIAL/PLATELET
BASOS ABS: 0 10*3/uL (ref 0.0–0.1)
Basophils Relative: 0 %
EOS ABS: 0.2 10*3/uL (ref 0.0–0.7)
EOS PCT: 3 %
HCT: 37.6 % — ABNORMAL LOW (ref 39.0–52.0)
Hemoglobin: 13 g/dL (ref 13.0–17.0)
Lymphocytes Relative: 45 %
Lymphs Abs: 3.4 10*3/uL (ref 0.7–4.0)
MCH: 29.2 pg (ref 26.0–34.0)
MCHC: 34.6 g/dL (ref 30.0–36.0)
MCV: 84.5 fL (ref 78.0–100.0)
Monocytes Absolute: 0.5 10*3/uL (ref 0.1–1.0)
Monocytes Relative: 7 %
NEUTROS PCT: 45 %
Neutro Abs: 3.5 10*3/uL (ref 1.7–7.7)
PLATELETS: 232 10*3/uL (ref 150–400)
RBC: 4.45 MIL/uL (ref 4.22–5.81)
RDW: 12.7 % (ref 11.5–15.5)
WBC: 7.7 10*3/uL (ref 4.0–10.5)

## 2017-07-07 LAB — COMPREHENSIVE METABOLIC PANEL
ALT: 134 U/L — ABNORMAL HIGH (ref 17–63)
AST: 59 U/L — AB (ref 15–41)
Albumin: 4.2 g/dL (ref 3.5–5.0)
Alkaline Phosphatase: 60 U/L (ref 38–126)
Anion gap: 7 (ref 5–15)
BILIRUBIN TOTAL: 1.4 mg/dL — AB (ref 0.3–1.2)
BUN: 8 mg/dL (ref 6–20)
CO2: 26 mmol/L (ref 22–32)
CREATININE: 0.91 mg/dL (ref 0.61–1.24)
Calcium: 9.3 mg/dL (ref 8.9–10.3)
Chloride: 105 mmol/L (ref 101–111)
GFR calc non Af Amer: >60 mL/min (ref >60)
Glucose, Bld: 81 mg/dL (ref 65–99)
Potassium: 3.8 mmol/L (ref 3.5–5.1)
Sodium: 138 mmol/L (ref 135–145)
Total Protein: 7 g/dL (ref 6.5–8.1)

## 2017-07-07 LAB — I-STAT CG4 LACTIC ACID, ED: Lactic Acid, Venous: 1.14 mmol/L (ref 0.5–1.9)

## 2017-07-07 LAB — I-STAT TROPONIN, ED: Troponin i, poc: 0 ng/mL (ref 0.00–0.08)

## 2017-07-07 MED ORDER — ONDANSETRON HCL 4 MG PO TABS: 4.0000 mg | ORAL_TABLET | Freq: Four times a day (QID) | ORAL | 0 refills | Status: DC | PRN

## 2017-07-07 MED ORDER — CLONIDINE HCL 0.1 MG/24HR TD PTWK
0.1000 mg | MEDICATED_PATCH | Freq: Once | TRANSDERMAL | Status: DC
  Administered 2017-07-07: 0.1 mg via TRANSDERMAL
  Filled 2017-07-07: qty 1

## 2017-07-07 MED ORDER — DICYCLOMINE HCL 20 MG PO TABS: 20.0000 mg | ORAL_TABLET | Freq: Four times a day (QID) | ORAL | 0 refills | Status: DC | PRN

## 2017-07-07 MED ORDER — LOPERAMIDE HCL 2 MG PO CAPS: 2.0000 mg | ORAL_CAPSULE | Freq: Two times a day (BID) | ORAL | 0 refills | Status: DC | PRN

## 2017-07-07 NOTE — ED Notes (Signed)
Pt called to obtain vitals, pt did not answer x3. 

## 2017-07-07 NOTE — ED Notes (Signed)
Pt back in waiting room 

## 2017-07-07 NOTE — ED Notes (Signed)
Pt states he used heroin two weeks ago and hes got marks on his arm that wont heal. Pt states hes on suboxone now.

## 2017-07-07 NOTE — ED Notes (Signed)
Pt is now stating he has sharp chest pains for two weeks

## 2017-07-07 NOTE — ED Triage Notes (Signed)
Pt states hes had an "infection in my legs" for a month and a half and states he took antibiotics but it didn't help, states its gotten worse and its spreading and he "feels bad". Pt poor historian. Pt states hes supposed to follow up with a dermatologist but hasn't. Pt is scared his infection is in his blood stream.
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
## 2017-07-07 NOTE — ED Notes (Signed)
Called several times in the lobby  No answer

## 2017-07-07 NOTE — Discharge Instructions (Signed)
Do not hesitate to return to the emergency room for any new, worsening or concerning symptoms.
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
## 2017-07-07 NOTE — ED Notes (Signed)
No answer from pt in lobby 

## 2017-07-14 DIAGNOSIS — R0789 Other chest pain: Secondary | ICD-10-CM | POA: Insufficient documentation

## 2017-07-14 DIAGNOSIS — M79671 Pain in right foot: Secondary | ICD-10-CM | POA: Insufficient documentation

## 2017-07-14 DIAGNOSIS — R21 Rash and other nonspecific skin eruption: Secondary | ICD-10-CM | POA: Insufficient documentation

## 2017-07-14 DIAGNOSIS — F17210 Nicotine dependence, cigarettes, uncomplicated: Secondary | ICD-10-CM | POA: Insufficient documentation

## 2017-07-14 DIAGNOSIS — M79672 Pain in left foot: Secondary | ICD-10-CM | POA: Insufficient documentation

## 2017-07-14 NOTE — ED Triage Notes (Signed)
Pt comes via EMS wanting detox from alcohol.  Pt is also on suboxone.  Reportedly walked from [place] and EMS was called to [address] where he was picked up.  Stated he was walking to the hospital.  Pt also reports chest wall pain that [facility] now with tests resulting as unremarkable.  Pain mostly on palpation. Reports last drink was 5 hours ago. 12-lead unremarkable in route. Vitals WNL.

## 2017-07-15 ENCOUNTER — Encounter (HOSPITAL_COMMUNITY): Payer: Self-pay | Admitting: Emergency Medicine

## 2017-07-15 ENCOUNTER — Other Ambulatory Visit: Payer: Self-pay

## 2017-07-15 ENCOUNTER — Emergency Department (HOSPITAL_COMMUNITY): Payer: Self-pay

## 2017-07-15 ENCOUNTER — Emergency Department (HOSPITAL_COMMUNITY)
Admission: EM | Admit: 2017-07-15 | Discharge: 2017-07-15 | Disposition: A | Discharge status: Home / Self Care | Payer: Self-pay | Attending: Emergency Medicine | Admitting: Emergency Medicine

## 2017-07-15 DIAGNOSIS — M79672 Pain in left foot: Secondary | ICD-10-CM

## 2017-07-15 DIAGNOSIS — R0789 Other chest pain: Secondary | ICD-10-CM

## 2017-07-15 DIAGNOSIS — M79671 Pain in right foot: Secondary | ICD-10-CM

## 2017-07-15 DIAGNOSIS — R21 Rash and other nonspecific skin eruption: Secondary | ICD-10-CM

## 2017-07-15 LAB — COMPREHENSIVE METABOLIC PANEL
ALT: 137 U/L — ABNORMAL HIGH (ref 17–63)
AST: 73 U/L — ABNORMAL HIGH (ref 15–41)
Albumin: 3.8 g/dL (ref 3.5–5.0)
Alkaline Phosphatase: 56 U/L (ref 38–126)
Anion gap: 8 (ref 5–15)
BUN: 9 mg/dL (ref 6–20)
CHLORIDE: 105 mmol/L (ref 101–111)
CO2: 27 mmol/L (ref 22–32)
Calcium: 9 mg/dL (ref 8.9–10.3)
Creatinine, Ser: 0.7 mg/dL (ref 0.61–1.24)
Glucose, Bld: 130 mg/dL — ABNORMAL HIGH (ref 65–99)
POTASSIUM: 3.5 mmol/L (ref 3.5–5.1)
SODIUM: 140 mmol/L (ref 135–145)
Total Bilirubin: 0.6 mg/dL (ref 0.3–1.2)
Total Protein: 6.4 g/dL — ABNORMAL LOW (ref 6.5–8.1)

## 2017-07-15 LAB — CBC WITH DIFFERENTIAL/PLATELET
BASOS ABS: 0 10*3/uL (ref 0.0–0.1)
Basophils Relative: 0 %
EOS ABS: 0.3 10*3/uL (ref 0.0–0.7)
EOS PCT: 3 %
HCT: 40.3 % (ref 39.0–52.0)
Hemoglobin: 13.7 g/dL (ref 13.0–17.0)
LYMPHS ABS: 3.7 10*3/uL (ref 0.7–4.0)
LYMPHS PCT: 31 %
MCH: 29.6 pg (ref 26.0–34.0)
MCHC: 34 g/dL (ref 30.0–36.0)
MCV: 87 fL (ref 78.0–100.0)
MONO ABS: 0.8 10*3/uL (ref 0.1–1.0)
Monocytes Relative: 7 %
Neutro Abs: 7.3 10*3/uL (ref 1.7–7.7)
Neutrophils Relative %: 59 %
PLATELETS: 211 10*3/uL (ref 150–400)
RBC: 4.63 MIL/uL (ref 4.22–5.81)
RDW: 13.1 % (ref 11.5–15.5)
WBC: 12.2 10*3/uL — ABNORMAL HIGH (ref 4.0–10.5)

## 2017-07-15 MED ORDER — CHLORDIAZEPOXIDE HCL 25 MG PO CAPS
100.0000 mg | ORAL_CAPSULE | Freq: Once | ORAL | Status: AC
  Administered 2017-07-15: 100 mg via ORAL
  Filled 2017-07-15: qty 4

## 2017-07-15 MED ORDER — SODIUM CHLORIDE 0.9 % IV BOLUS (SEPSIS)
1000.0000 mL | Freq: Once | INTRAVENOUS | Status: AC
  Administered 2017-07-15: 1000 mL via INTRAVENOUS

## 2017-07-15 NOTE — ED Notes (Signed)
Pt. Refused to be discharged, Charge Nurse at the room , Security notified.
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
## 2017-07-15 NOTE — Discharge Instructions (Signed)
Follow up with your family doc.  °

## 2017-07-31 ENCOUNTER — Encounter (HOSPITAL_COMMUNITY): Payer: Self-pay | Admitting: Emergency Medicine

## 2017-07-31 DIAGNOSIS — R45851 Suicidal ideations: Secondary | ICD-10-CM | POA: Insufficient documentation

## 2017-07-31 DIAGNOSIS — F17210 Nicotine dependence, cigarettes, uncomplicated: Secondary | ICD-10-CM | POA: Insufficient documentation

## 2017-07-31 DIAGNOSIS — F909 Attention-deficit hyperactivity disorder, unspecified type: Secondary | ICD-10-CM | POA: Insufficient documentation

## 2017-07-31 DIAGNOSIS — F1410 Cocaine abuse, uncomplicated: Secondary | ICD-10-CM | POA: Insufficient documentation

## 2017-07-31 DIAGNOSIS — F1110 Opioid abuse, uncomplicated: Secondary | ICD-10-CM | POA: Insufficient documentation

## 2017-07-31 DIAGNOSIS — B1710 Acute hepatitis C without hepatic coma: Secondary | ICD-10-CM | POA: Insufficient documentation

## 2017-07-31 DIAGNOSIS — F99 Mental disorder, not otherwise specified: Secondary | ICD-10-CM | POA: Insufficient documentation

## 2017-07-31 LAB — COMPREHENSIVE METABOLIC PANEL
ALBUMIN: 4.1 g/dL (ref 3.5–5.0)
ALT: 138 U/L — AB (ref 17–63)
AST: 47 U/L — AB (ref 15–41)
Alkaline Phosphatase: 64 U/L (ref 38–126)
Anion gap: 7 (ref 5–15)
BUN: 6 mg/dL (ref 6–20)
CHLORIDE: 106 mmol/L (ref 101–111)
CO2: 26 mmol/L (ref 22–32)
CREATININE: 0.81 mg/dL (ref 0.61–1.24)
Calcium: 9.1 mg/dL (ref 8.9–10.3)
GFR calc Af Amer: >60 mL/min (ref >60)
GLUCOSE: 105 mg/dL — AB (ref 65–99)
POTASSIUM: 3.6 mmol/L (ref 3.5–5.1)
Sodium: 139 mmol/L (ref 135–145)
Total Bilirubin: 0.8 mg/dL (ref 0.3–1.2)
Total Protein: 6.9 g/dL (ref 6.5–8.1)

## 2017-07-31 LAB — CBC
HEMATOCRIT: 41.1 % (ref 39.0–52.0)
HEMOGLOBIN: 13.8 g/dL (ref 13.0–17.0)
MCH: 29.4 pg (ref 26.0–34.0)
MCHC: 33.6 g/dL (ref 30.0–36.0)
MCV: 87.6 fL (ref 78.0–100.0)
Platelets: 196 10*3/uL (ref 150–400)
RBC: 4.69 MIL/uL (ref 4.22–5.81)
RDW: 13.1 % (ref 11.5–15.5)
WBC: 8.4 10*3/uL (ref 4.0–10.5)

## 2017-07-31 LAB — RAPID URINE DRUG SCREEN, HOSP PERFORMED
Amphetamines: NOT DETECTED
BARBITURATES: NOT DETECTED
Benzodiazepines: NOT DETECTED
Cocaine: POSITIVE — AB
Opiates: POSITIVE — AB
TETRAHYDROCANNABINOL: NOT DETECTED

## 2017-07-31 LAB — SALICYLATE LEVEL

## 2017-07-31 LAB — ETHANOL

## 2017-07-31 LAB — ACETAMINOPHEN LEVEL

## 2017-08-01 ENCOUNTER — Inpatient Hospital Stay (HOSPITAL_COMMUNITY)
Admission: AD | Admit: 2017-08-01 | Payer: No Typology Code available for payment source | Source: Intra-hospital | Admitting: Psychiatry

## 2017-08-01 ENCOUNTER — Emergency Department (HOSPITAL_COMMUNITY)
Admission: EM | Admit: 2017-08-01 | Discharge: 2017-08-03 | Disposition: A | Discharge status: Home / Self Care | Payer: Self-pay | Attending: Emergency Medicine | Admitting: Emergency Medicine

## 2017-08-01 DIAGNOSIS — F1910 Other psychoactive substance abuse, uncomplicated: Secondary | ICD-10-CM

## 2017-08-01 DIAGNOSIS — R45851 Suicidal ideations: Secondary | ICD-10-CM

## 2017-08-01 MED ORDER — BUPRENORPHINE HCL-NALOXONE HCL 2-0.5 MG SL SUBL
1.0000 | SUBLINGUAL_TABLET | Freq: Two times a day (BID) | SUBLINGUAL | Status: DC
  Administered 2017-08-01 – 2017-08-03 (×5): 1 via SUBLINGUAL
  Filled 2017-08-01 (×5): qty 1

## 2017-08-01 MED ORDER — CLONIDINE HCL 0.2 MG PO TABS
0.1000 mg | ORAL_TABLET | Freq: Two times a day (BID) | ORAL | Status: DC | PRN
  Administered 2017-08-01 – 2017-08-02 (×3): 0.1 mg via ORAL
  Filled 2017-08-01 (×3): qty 1

## 2017-08-01 NOTE — ED Notes (Signed)
F/u with service response. Order received.

## 2017-08-01 NOTE — ED Notes (Signed)
Pt aware his mother is requesting an update. Pt using the phone at this time to call his mother. Phone number did not work, pt reports he will try again later.
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
## 2017-08-01 NOTE — ED Notes (Signed)
[phone] Munoz- Shenna ( mother), called to check on the PT. Would like an update.

## 2017-08-01 NOTE — BH Assessment (Signed)
Reassessment- Pt reports SI with no plan. Pt denies HI and AVH. Pt reports Suboxone use. Pt states he cannot stop taking Suboxone. Pt states he has a prescription for Suboxone but he does not know who prescribes the Suboxone.  Inetta Fermoina, NP recommends that Pt be IVCd if the Pt attempts to leave the ED.  TTS will continue to seek placement.   Wolfgang PhoenixBrandi Kavari Parrillo, [facility] Triage Specialist

## 2017-08-01 NOTE — Progress Notes (Addendum)
Patient meets criteria for inpatient treatment. CSW faxed referrals to the following inpatient facilities for review:  [facility], [facility], [facility], [facility], [facility], [facility], [facility], [facility], [facility]    TTS will continue to seek bed placement.  Mirabella Hilario MSW, LCSWA CSW Disposition [phone]   

## 2017-08-01 NOTE — ED Notes (Signed)
Patient refusing to get up to urinate, requesting urinal. Sitter at bedside.

## 2017-08-01 NOTE — ED Notes (Signed)
Dinner tray at the bedside. Pt requesting ordered suboxone, explained to pt it is not due for the next few hours but will bring it to pt as soon as it is time for administration.

## 2017-08-01 NOTE — ED Notes (Signed)
Pt on the phone at this time with his mother to give family an update.

## 2017-08-01 NOTE — ED Notes (Signed)
Dinner order placed 

## 2017-08-01 NOTE — ED Notes (Signed)
Pt belongings accounted for a located in locker #6 with inventory sheet available in front sleeve.

## 2017-08-01 NOTE — ED Provider Notes (Addendum)
Patient seen and evaluated. He remains suicidal. His biggest complaint at this time is that he is experiencing symptoms of withdrawal with irritability abdominal cramping sweating. He states he is on Suboxone. [facility] [place]. Written  by Dr. Roselie Awkwardharles Plummer at a substance abuse clinic. I then contacted the clinic and was able to confirm through Dr. Derry LoryPlummer's medical assistant that this was a prescription written by him.  He is placed in her IVC by myself as he is attempting to leave and remains suicidal. I written for his 2 mg sublingual Suboxone films twice a day   Oscar Munoz, Oscar Gonyea, MD 08/01/17 1300    Oscar Munoz, Oscar Drum, MD 08/01/17 1302

## 2017-08-01 NOTE — ED Provider Notes (Signed)
TIME SEEN: 1:16 AM  By signing my name below, I, Diona Browner, attest that this documentation has been prepared under the direction and in the presence of Reinhard Schack, Layla Maw, DO. Electronically Signed: Diona Browner, ED Scribe. 08/01/17. 1:19 AM.  CHIEF COMPLAINT: Suicidal  HPI: Oscar Munoz is a 21 y.o. male who presents to the Emergency Department with a chief complaint of suicidal ideations that started PTA. He currently doesn't have a set plan, but has had previous suicide attempt in the last year. Pt states he has given up on everything. He has used heroin this past week. Pt denies HI, hallucinations.  Patient denies any medical complaints including new pain, fever, cough, diarrhea, and vomiting.  ROS: See HPI Constitutional: no fever  Eyes: no drainage  ENT: no runny nose   Cardiovascular:  no chest pain  Resp: no SOB  GI: no vomiting GU: no dysuria Integumentary: no rash  Allergy: no hives  Musculoskeletal: no leg swelling  Neurological: no slurred speech ROS otherwise negative  PAST MEDICAL HISTORY/PAST SURGICAL HISTORY:  Past Medical History:  Diagnosis Date  . Attention deficit hyperactivity disorder (ADHD)   . Hepatitis C     MEDICATIONS:  Prior to Admission medications   Medication Sig Start Date End Date Taking? Authorizing Provider  diazepam (VALIUM) 2 MG tablet Take 1 tablet (2 mg total) by mouth every 8 (eight) hours as needed. 07/09/15   Triplett, Rulon Eisenmenger B, FNP  dicyclomine (BENTYL) 20 MG tablet Take 1 tablet (20 mg total) by mouth every 6 (six) hours as needed for spasms (Abdominal cramping). 07/07/17   Pisciotta, Joni Reining, PA-C  loperamide (IMODIUM) 2 MG capsule Take 1 capsule (2 mg total) by mouth 2 (two) times daily as needed for diarrhea or loose stools. 07/07/17   Pisciotta, Joni Reining, PA-C  ondansetron (ZOFRAN) 4 MG tablet Take 1 tablet (4 mg total) by mouth every 6 (six) hours as needed for nausea or vomiting. 07/07/17   Pisciotta, Joni Reining, PA-C    ALLERGIES:  No  Known Allergies  SOCIAL HISTORY:  Social History  Substance Use Topics  . Smoking status: Current Every Day Smoker    Packs/day: 1.50    Years: 8.00    Types: Cigarettes  . Smokeless tobacco: Never Used  . Alcohol use No    FAMILY HISTORY: Family History  Problem Relation Age of Onset  . Family history unknown: Yes    EXAM: BP 130/90 (BP Location: Right Arm)   Pulse 96   Temp 98 F (36.7 C) (Oral)   Resp 16   Ht 6\' 3"  (1.905 m)   SpO2 99%  CONSTITUTIONAL: Alert and oriented and responds appropriately to questions. Chronically ill-appearing, afebrile, nontoxic, well-hydrated HEAD: Normocephalic EYES: Conjunctivae clear, pupils appear equal, EOMI ENT: normal nose; moist mucous membranes NECK: Supple, no meningismus, no nuchal rigidity, no LAD  CARD: RRR; S1 and S2 appreciated; no murmurs, no clicks, no rubs, no gallops RESP: Normal chest excursion without splinting or tachypnea; breath sounds clear and equal bilaterally; no wheezes, no rhonchi, no rales, no hypoxia or respiratory distress, speaking full sentences ABD/GI: Normal bowel sounds; non-distended; soft, non-tender, no rebound, no guarding, no peritoneal signs, no hepatosplenomegaly BACK:  The back appears normal and is non-tender to palpation, there is no CVA tenderness EXT: Normal ROM in all joints; non-tender to palpation; no edema; normal capillary refill; no cyanosis, no calf tenderness or swelling    SKIN: Normal color for age and race; warm; no rash NEURO: Moves all extremities equally,  normal speech, normal gait PSYCH: Patient has flat affect. Endorses suicidal ideation. No HI or hallucinations.  MEDICAL DECISION MAKING: Patient here with suicidal thoughts without plan. Has history of previous suicide attempt. Reports history of heroin abuse. Urine drug screen is positive for opiates but also cocaine. Otherwise medical screening labs unremarkable other than mildly elevated AST and ALT. No abdominal pain. He has  no current medical complaints. This time he is medically cleared. We will consult TTS.  Dispo per TTS.   I reviewed all nursing notes, vitals, pertinent previous records, EKGs, lab and urine results, imaging (as available).    I personally performed the services described in this documentation, which was scribed in my presence. The recorded information has been reviewed and is accurate.     Chaselynn Kepple, Layla MawKristen N, DO 08/01/17 [phone]

## 2017-08-02 NOTE — ED Notes (Signed)
EDP made aware of pt's pulse rate. Strong radial pulses noted. Pt AOX4. Pt resting comfortably at this time.

## 2017-08-02 NOTE — ED Notes (Signed)
Pt ambulated to shower with sitter with steady gait

## 2017-08-02 NOTE — ED Notes (Signed)
Regular Diet has been ordered for Dinner. 

## 2017-08-02 NOTE — ED Notes (Signed)
Pt given meal tray.

## 2017-08-02 NOTE — ED Notes (Signed)
Pt reports to staff the only way he could come off of drugs is if he were to be in a rehab alone for 2 years but he would still need suboxone for chronic pain.

## 2017-08-02 NOTE — ED Notes (Signed)
Breakfast order sent via email.  

## 2017-08-03 DIAGNOSIS — F1190 Opioid use, unspecified, uncomplicated: Secondary | ICD-10-CM

## 2017-08-03 DIAGNOSIS — F1490 Cocaine use, unspecified, uncomplicated: Secondary | ICD-10-CM

## 2017-08-03 DIAGNOSIS — Z599 Problem related to housing and economic circumstances, unspecified: Secondary | ICD-10-CM

## 2017-08-03 DIAGNOSIS — F17210 Nicotine dependence, cigarettes, uncomplicated: Secondary | ICD-10-CM

## 2017-08-03 DIAGNOSIS — F1910 Other psychoactive substance abuse, uncomplicated: Secondary | ICD-10-CM | POA: Insufficient documentation

## 2017-08-03 DIAGNOSIS — F1290 Cannabis use, unspecified, uncomplicated: Secondary | ICD-10-CM

## 2017-08-03 DIAGNOSIS — Z560 Unemployment, unspecified: Secondary | ICD-10-CM

## 2017-08-03 DIAGNOSIS — Z590 Homelessness: Secondary | ICD-10-CM

## 2017-08-03 DIAGNOSIS — R45851 Suicidal ideations: Secondary | ICD-10-CM

## 2017-08-03 NOTE — ED Notes (Signed)
Telepsych machine in room for eval

## 2017-08-03 NOTE — ED Notes (Signed)
Pt completed TTS, pt given snack

## 2017-08-03 NOTE — ED Notes (Signed)
Lunch tray ordered 
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
## 2017-08-03 NOTE — Progress Notes (Signed)
Per Ferne ReusJustina Okonkwo, NP, the patient does not meet criteria for inpatient treatment. Patient is recommended for discharge.   Donnetta SimpersJanee Crews, RN notified.    Baldo DaubJolan Mardee Clune MSW, LCSWA CSW Disposition [phone]

## 2017-08-03 NOTE — ED Notes (Signed)
Pt requesting graham crackers and milk. Informed pt he can only have water at this time, next meal would be provided at breakfast.

## 2017-08-10 ENCOUNTER — Encounter: Payer: Self-pay | Admitting: *Deleted

## 2017-08-10 ENCOUNTER — Emergency Department
Admission: EM | Admit: 2017-08-10 | Discharge: 2017-08-11 | Disposition: A | Discharge status: Home / Self Care | Payer: Medicaid Other | Source: Home / Self Care | Attending: Emergency Medicine | Admitting: Emergency Medicine

## 2017-08-10 ENCOUNTER — Emergency Department: Payer: Medicaid Other

## 2017-08-10 ENCOUNTER — Emergency Department
Admission: EM | Admit: 2017-08-10 | Discharge: 2017-08-10 | Disposition: A | Discharge status: Home / Self Care | Payer: Medicaid Other | Attending: Emergency Medicine | Admitting: Emergency Medicine

## 2017-08-10 DIAGNOSIS — F1123 Opioid dependence with withdrawal: Secondary | ICD-10-CM

## 2017-08-10 DIAGNOSIS — F209 Schizophrenia, unspecified: Secondary | ICD-10-CM | POA: Insufficient documentation

## 2017-08-10 DIAGNOSIS — Z5321 Procedure and treatment not carried out due to patient leaving prior to being seen by health care provider: Secondary | ICD-10-CM | POA: Insufficient documentation

## 2017-08-10 DIAGNOSIS — F1193 Opioid use, unspecified with withdrawal: Secondary | ICD-10-CM

## 2017-08-10 LAB — COMPREHENSIVE METABOLIC PANEL
ALT: 42 U/L (ref 17–63)
ANION GAP: 5 (ref 5–15)
AST: 34 U/L (ref 15–41)
Albumin: 3.9 g/dL (ref 3.5–5.0)
Alkaline Phosphatase: 54 U/L (ref 38–126)
BUN: 9 mg/dL (ref 6–20)
CHLORIDE: 108 mmol/L (ref 101–111)
CO2: 26 mmol/L (ref 22–32)
Calcium: 8.6 mg/dL — ABNORMAL LOW (ref 8.9–10.3)
Creatinine, Ser: 0.81 mg/dL (ref 0.61–1.24)
GFR calc non Af Amer: >60 mL/min (ref >60)
Glucose, Bld: 126 mg/dL — ABNORMAL HIGH (ref 65–99)
POTASSIUM: 4 mmol/L (ref 3.5–5.1)
SODIUM: 139 mmol/L (ref 135–145)
Total Bilirubin: 1.3 mg/dL — ABNORMAL HIGH (ref 0.3–1.2)
Total Protein: 6.4 g/dL — ABNORMAL LOW (ref 6.5–8.1)

## 2017-08-10 LAB — CBC WITH DIFFERENTIAL/PLATELET
BASOS ABS: 0 10*3/uL (ref 0–0.1)
BASOS PCT: 1 %
EOS PCT: 4 %
Eosinophils Absolute: 0.2 10*3/uL (ref 0–0.7)
HEMATOCRIT: 37.7 % — AB (ref 40.0–52.0)
Hemoglobin: 13.3 g/dL (ref 13.0–18.0)
LYMPHS PCT: 44 %
Lymphs Abs: 2.3 10*3/uL (ref 1.0–3.6)
MCH: 30.7 pg (ref 26.0–34.0)
MCHC: 35.4 g/dL (ref 32.0–36.0)
MCV: 86.8 fL (ref 80.0–100.0)
Monocytes Absolute: 0.5 10*3/uL (ref 0.2–1.0)
Monocytes Relative: 9 %
NEUTROS ABS: 2.2 10*3/uL (ref 1.4–6.5)
Neutrophils Relative %: 42 %
PLATELETS: 225 10*3/uL (ref 150–440)
RBC: 4.34 MIL/uL — AB (ref 4.40–5.90)
RDW: 13.3 % (ref 11.5–14.5)
WBC: 5.2 10*3/uL (ref 3.8–10.6)

## 2017-08-10 LAB — SALICYLATE LEVEL

## 2017-08-10 LAB — ETHANOL

## 2017-08-10 LAB — ACETAMINOPHEN LEVEL

## 2017-08-10 LAB — SEDIMENTATION RATE: Sed Rate: 2 mm/hr (ref 0–15)

## 2017-08-10 NOTE — ED Notes (Signed)
Called in waiting room.  Patient's belongings remain in waiting room and other visitors state patient went down to the Cafeteria.  Will call patient again.

## 2017-08-10 NOTE — ED Notes (Signed)
Pt sat up and ate dinner, given 4 oz of sprite. Pt then laid back down and went to sleep. Will continue to monitor for further patient needs.
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
## 2017-08-10 NOTE — ED Notes (Signed)
Pt continues to rest in bed with NAD noted. Pt noted to be sweating somewhat on his neck. Pt awakens with some tapping on his chest then goes back to sleep. Will continue to monitor for further patient needs.

## 2017-08-10 NOTE — ED Notes (Signed)
Called patient.  No response.  Belongings remain in ED.

## 2017-08-10 NOTE — ED Notes (Signed)
Upon triaging pt pt then decides he wants to leave, states he has to "see his family", pt instructed on the triage process, pt states "I just wanted to see whats going on, dont be mad", pt then decided to leave and walked out of triage room and out of lobby, 1st nurse aware

## 2017-08-10 NOTE — ED Triage Notes (Signed)
First Nurse Note:  Arrives stating patient wants Detox and also to be evaluated by psychiatry to be put "back on medication".  Patient states he has history of Bipolar and Schizophrenia and has been off of medication for 8 months.  Denies SI/ HI.

## 2017-08-10 NOTE — ED Triage Notes (Signed)
Pt states he is here for helping getting back on his psych medicine, states he wants detox from cocaine, heroin and molly, last used this AM, denies SI or HI, states he was recently at [facility] for 30 days, states he wants to be tested for HIV,  Pt appears altered

## 2017-08-10 NOTE — ED Notes (Signed)
Pt continues to be resting in bed, NAD noted at this time. Pt under his comforter. Will continue to monitor for further patient needs.

## 2017-08-10 NOTE — ED Notes (Addendum)
No change in patient condition. Pt resting in bed with, respirations even and unlabored. Will continue to monitor for further patient needs.

## 2017-08-10 NOTE — ED Notes (Signed)
Pt continues to be resting in bed, NAD noted at this time. Respirations even and unlabored, skin warm, dry, and intact. Will continue to monitor for further patient needs.

## 2017-08-10 NOTE — ED Notes (Signed)
First Nurse Note:  Patient returns to ED with back pack and pillow.  Denies S/HI.  Seeking psychiatric evaluation and medications for Bipolar and Schizophrenia.  Patient is calm and cooperative.

## 2017-08-10 NOTE — ED Triage Notes (Signed)
Pt returns for detox and "to get back on his medicines", states when he stops drugs he needs his medicine so he does not get depressed, denies SI or HI

## 2017-08-11 NOTE — BH Assessment (Signed)
Attempted to assess the patient.  The patient was sleep and would not speak when he was awoken.  Will complete TTS consult when the patient is alert.

## 2017-08-11 NOTE — ED Notes (Signed)
TTS came to the patient for the 3rd time tonight to talk to patient, patient refused to talk to TTS.

## 2017-08-11 NOTE — ED Notes (Signed)
Pt. Refused to sign for paperwork.   Pt. Refused to give urine specimen.  Pt. Refused discharge paperwork.  Pt. Refused vitals.  Pt. Upset upon discharge.  Pt. Advised to return if he was serious about detoxing.

## 2017-08-11 NOTE — ED Notes (Signed)
Pt. Accompanied by [employer] out ambulance bay.

## 2017-08-11 NOTE — Discharge Instructions (Signed)
Please seek medical attention and help for any thoughts about wanting to harm yourself, harm others, any concerning change in behavior, severe depression, inappropriate drug use or any other new or concerning symptoms. ° °

## 2017-08-11 NOTE — BH Assessment (Signed)
Attempted to complete TTS consult.  The patient said, "Leave me alone man" twice.  Will attempt to complete the assessment at another time.

## 2017-08-11 NOTE — ED Provider Notes (Signed)
Patient refusing to talk to TTS. He appears clinically sober at this time. Will discharge in give outpatient follow-up resources.   Phineas Semen, MD 08/11/17 [phone]

## 2017-08-15 LAB — CULTURE, BLOOD (ROUTINE X 2)
CULTURE: NO GROWTH
Culture: NO GROWTH
Special Requests: ADEQUATE
Special Requests: ADEQUATE

## 2017-12-26 ENCOUNTER — Encounter: Payer: Self-pay | Admitting: Emergency Medicine

## 2017-12-26 ENCOUNTER — Emergency Department
Admission: EM | Admit: 2017-12-26 | Discharge: 2017-12-26 | Disposition: A | Discharge status: Home / Self Care | Payer: Self-pay | Attending: Emergency Medicine | Admitting: Emergency Medicine

## 2017-12-26 ENCOUNTER — Emergency Department: Payer: Self-pay

## 2017-12-26 DIAGNOSIS — Z79899 Other long term (current) drug therapy: Secondary | ICD-10-CM | POA: Insufficient documentation

## 2017-12-26 DIAGNOSIS — F1110 Opioid abuse, uncomplicated: Secondary | ICD-10-CM | POA: Insufficient documentation

## 2017-12-26 DIAGNOSIS — F17210 Nicotine dependence, cigarettes, uncomplicated: Secondary | ICD-10-CM | POA: Insufficient documentation

## 2017-12-26 DIAGNOSIS — Z7721 Contact with and (suspected) exposure to potentially hazardous body fluids: Secondary | ICD-10-CM | POA: Insufficient documentation

## 2017-12-26 DIAGNOSIS — R531 Weakness: Secondary | ICD-10-CM | POA: Insufficient documentation

## 2017-12-26 LAB — COMPREHENSIVE METABOLIC PANEL
ALBUMIN: 3.9 g/dL (ref 3.5–5.0)
ALT: 63 U/L (ref 17–63)
ANION GAP: 9 (ref 5–15)
AST: 35 U/L (ref 15–41)
Alkaline Phosphatase: 66 U/L (ref 38–126)
BUN: 11 mg/dL (ref 6–20)
CHLORIDE: 107 mmol/L (ref 101–111)
CO2: 22 mmol/L (ref 22–32)
Calcium: 8.9 mg/dL (ref 8.9–10.3)
Creatinine, Ser: 0.77 mg/dL (ref 0.61–1.24)
GFR calc non Af Amer: >60 mL/min (ref >60)
GLUCOSE: 84 mg/dL (ref 65–99)
Potassium: 3.7 mmol/L (ref 3.5–5.1)
SODIUM: 138 mmol/L (ref 135–145)
Total Bilirubin: 0.8 mg/dL (ref 0.3–1.2)
Total Protein: 7 g/dL (ref 6.5–8.1)

## 2017-12-26 LAB — CBC
HCT: 43.6 % (ref 40.0–52.0)
Hemoglobin: 14.3 g/dL (ref 13.0–18.0)
MCH: 29.4 pg (ref 26.0–34.0)
MCHC: 32.7 g/dL (ref 32.0–36.0)
MCV: 89.6 fL (ref 80.0–100.0)
PLATELETS: 179 10*3/uL (ref 150–440)
RBC: 4.87 MIL/uL (ref 4.40–5.90)
RDW: 14 % (ref 11.5–14.5)
WBC: 11.8 10*3/uL — ABNORMAL HIGH (ref 3.8–10.6)

## 2017-12-26 LAB — RAPID HIV SCREEN (HIV 1/2 AB+AG)
HIV 1/2 Antibodies: NONREACTIVE
HIV-1 P24 Antigen - HIV24: NONREACTIVE
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
## 2017-12-26 NOTE — ED Notes (Signed)
Patient transported to X-ray 

## 2017-12-26 NOTE — ED Notes (Signed)
Pt walked out of room as if he is leaving, states he needs to go out to his car to check on his money.  This RN escorted pt to lobby, advised him that [employer] officer would escort him to and back from his vehicle.  Pt decided against going out to vehicle, escorted back to his room per this RN.
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
## 2017-12-26 NOTE — Discharge Instructions (Signed)
As we discussed please follow-up with the health department for recheck/reevaluation in 2-4 weeks including a repeat HIV test.  Please drink plenty of fluids, obtain plenty of rest.  Return to the emergency department for any worsening symptoms or any other symptom personally concerning to yourself.

## 2018-10-15 ENCOUNTER — Other Ambulatory Visit: Payer: Self-pay

## 2018-10-15 ENCOUNTER — Encounter (HOSPITAL_COMMUNITY): Payer: Self-pay

## 2018-10-15 ENCOUNTER — Emergency Department (HOSPITAL_COMMUNITY): Payer: Self-pay

## 2018-10-15 ENCOUNTER — Emergency Department (HOSPITAL_COMMUNITY)
Admission: EM | Admit: 2018-10-15 | Discharge: 2018-10-16 | Payer: Self-pay | Attending: Emergency Medicine | Admitting: Emergency Medicine

## 2018-10-15 DIAGNOSIS — F1910 Other psychoactive substance abuse, uncomplicated: Secondary | ICD-10-CM | POA: Insufficient documentation

## 2018-10-15 DIAGNOSIS — O99320 Drug use complicating pregnancy, unspecified trimester: Secondary | ICD-10-CM

## 2018-10-15 DIAGNOSIS — J40 Bronchitis, not specified as acute or chronic: Secondary | ICD-10-CM | POA: Insufficient documentation

## 2018-10-15 DIAGNOSIS — A6001 Herpesviral infection of penis: Secondary | ICD-10-CM | POA: Insufficient documentation

## 2018-10-15 DIAGNOSIS — Z008 Encounter for other general examination: Secondary | ICD-10-CM | POA: Insufficient documentation

## 2018-10-15 DIAGNOSIS — R079 Chest pain, unspecified: Secondary | ICD-10-CM

## 2018-10-15 DIAGNOSIS — B1710 Acute hepatitis C without hepatic coma: Secondary | ICD-10-CM | POA: Insufficient documentation

## 2018-10-15 HISTORY — DX: Chronic viral hepatitis C: B18.2

## 2018-10-15 LAB — RAPID URINE DRUG SCREEN, HOSP PERFORMED
AMPHETAMINES: POSITIVE — AB
BARBITURATES: NOT DETECTED
Benzodiazepines: NOT DETECTED
Cocaine: NOT DETECTED
OPIATES: POSITIVE — AB
TETRAHYDROCANNABINOL: NOT DETECTED

## 2018-10-15 LAB — COMPREHENSIVE METABOLIC PANEL
ALK PHOS: 58 U/L (ref 38–126)
ALT: 64 U/L — ABNORMAL HIGH (ref 0–44)
AST: 54 U/L — AB (ref 15–41)
Albumin: 3.7 g/dL (ref 3.5–5.0)
Anion gap: 8 (ref 5–15)
BILIRUBIN TOTAL: 1.2 mg/dL (ref 0.3–1.2)
BUN: 14 mg/dL (ref 6–20)
CO2: 23 mmol/L (ref 22–32)
CREATININE: 0.78 mg/dL (ref 0.61–1.24)
Calcium: 8.7 mg/dL — ABNORMAL LOW (ref 8.9–10.3)
Chloride: 106 mmol/L (ref 98–111)
GFR calc Af Amer: >60 mL/min (ref >60)
GFR calc non Af Amer: >60 mL/min (ref >60)
GLUCOSE: 84 mg/dL (ref 70–99)
Potassium: 3.5 mmol/L (ref 3.5–5.1)
Sodium: 137 mmol/L (ref 135–145)
TOTAL PROTEIN: 6.5 g/dL (ref 6.5–8.1)

## 2018-10-15 LAB — CBC WITH DIFFERENTIAL/PLATELET
ABS IMMATURE GRANULOCYTES: 0.03 10*3/uL (ref 0.00–0.07)
Basophils Absolute: 0.1 10*3/uL (ref 0.0–0.1)
Basophils Relative: 1 %
Eosinophils Absolute: 0.2 10*3/uL (ref 0.0–0.5)
Eosinophils Relative: 2 %
HCT: 36.9 % — ABNORMAL LOW (ref 39.0–52.0)
HEMOGLOBIN: 12.2 g/dL — AB (ref 13.0–17.0)
Immature Granulocytes: 0 %
LYMPHS PCT: 32 %
Lymphs Abs: 2.9 10*3/uL (ref 0.7–4.0)
MCH: 28.6 pg (ref 26.0–34.0)
MCHC: 33.1 g/dL (ref 30.0–36.0)
MCV: 86.6 fL (ref 80.0–100.0)
MONO ABS: 0.9 10*3/uL (ref 0.1–1.0)
Monocytes Relative: 10 %
NEUTROS ABS: 5.1 10*3/uL (ref 1.7–7.7)
Neutrophils Relative %: 55 %
Platelets: 235 10*3/uL (ref 150–400)
RBC: 4.26 MIL/uL (ref 4.22–5.81)
RDW: 12.5 % (ref 11.5–15.5)
WBC: 9.1 10*3/uL (ref 4.0–10.5)
nRBC: 0 % (ref 0.0–0.2)

## 2018-10-15 LAB — URINALYSIS, ROUTINE W REFLEX MICROSCOPIC
BILIRUBIN URINE: NEGATIVE
GLUCOSE, UA: NEGATIVE mg/dL
HGB URINE DIPSTICK: NEGATIVE
Ketones, ur: 20 mg/dL — AB
Leukocytes, UA: NEGATIVE
NITRITE: NEGATIVE
Protein, ur: NEGATIVE mg/dL
SPECIFIC GRAVITY, URINE: 1.009 (ref 1.005–1.030)
pH: 6 (ref 5.0–8.0)

## 2018-10-15 LAB — PROTIME-INR
INR: 1.01
PROTHROMBIN TIME: 13.2 s (ref 11.4–15.2)

## 2018-10-15 LAB — ETHANOL: Alcohol, Ethyl (B): <10 mg/dL (ref <10)

## 2018-10-15 LAB — TROPONIN I

## 2018-10-15 LAB — ACETAMINOPHEN LEVEL: Acetaminophen (Tylenol), Serum: <10 ug/mL — ABNORMAL LOW (ref 10–30)

## 2018-10-15 LAB — LIPASE, BLOOD: Lipase: 19 U/L (ref 11–51)

## 2018-10-15 LAB — I-STAT CG4 LACTIC ACID, ED: Lactic Acid, Venous: 0.5 mmol/L (ref 0.5–1.9)

## 2018-10-15 MED ORDER — ACYCLOVIR 400 MG PO TABS: 400.0000 mg | ORAL_TABLET | Freq: Three times a day (TID) | ORAL | 0 refills | Status: AC

## 2018-10-15 MED ORDER — ALBUTEROL SULFATE HFA 108 (90 BASE) MCG/ACT IN AERS: 2.0000 | INHALATION_SPRAY | Freq: Once | RESPIRATORY_TRACT | Status: DC

## 2018-10-15 MED ORDER — ALBUTEROL SULFATE HFA 108 (90 BASE) MCG/ACT IN AERS: 2.0000 | INHALATION_SPRAY | RESPIRATORY_TRACT | 0 refills | Status: AC | PRN
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
## 2018-10-15 NOTE — Discharge Instructions (Signed)
1.  Patient is evaluated for substance abuse.  He has known IV drug abuse.  He does have injection sites but none appear to be acutely infected.  Watch for signs of redness, drainage. 2.  Patient has genital herpes.  He can be treated with acyclovir as prescribed. 3.  Patient has dry cough.  He has no fever.  Chest x-ray is clear.  No elevation in his white blood cell count.  4.  Patient should have a recheck with facility provider in the next 3 to 5 days.
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
## 2018-10-16 ENCOUNTER — Encounter: Payer: Self-pay | Admitting: Emergency Medicine

## 2018-10-16 NOTE — ED Notes (Signed)
Pt stated "I was in the slow classes @ school.  I didn't graduate.  I don't know my SS# because my dad took care of all that.  I haven't seen him in 2-3 years.  I don't have any place to stay."

## 2018-10-16 NOTE — ED Notes (Signed)
Pt pulling on oxygen sensor when registering 89%.  Pt remains @ 100%.

## 2019-07-10 ENCOUNTER — Encounter: Payer: Self-pay | Admitting: *Deleted

## 2019-07-10 ENCOUNTER — Other Ambulatory Visit: Payer: Self-pay

## 2019-07-10 ENCOUNTER — Emergency Department
Admission: EM | Admit: 2019-07-10 | Discharge: 2019-07-10 | Disposition: A | Discharge status: Home / Self Care | Payer: Self-pay | Attending: Emergency Medicine | Admitting: Emergency Medicine

## 2019-07-10 DIAGNOSIS — Z20828 Contact with and (suspected) exposure to other viral communicable diseases: Secondary | ICD-10-CM | POA: Insufficient documentation

## 2019-07-10 DIAGNOSIS — T50901A Poisoning by unspecified drugs, medicaments and biological substances, accidental (unintentional), initial encounter: Secondary | ICD-10-CM | POA: Insufficient documentation

## 2019-07-10 DIAGNOSIS — F17210 Nicotine dependence, cigarettes, uncomplicated: Secondary | ICD-10-CM | POA: Insufficient documentation

## 2019-07-10 DIAGNOSIS — Z79899 Other long term (current) drug therapy: Secondary | ICD-10-CM | POA: Insufficient documentation

## 2019-07-10 LAB — SARS CORONAVIRUS 2 BY RT PCR (HOSPITAL ORDER, PERFORMED IN ~~LOC~~ HOSPITAL LAB): SARS Coronavirus 2: NEGATIVE

## 2019-07-10 LAB — COMPREHENSIVE METABOLIC PANEL
ALT: 128 U/L — ABNORMAL HIGH (ref 0–44)
AST: 99 U/L — ABNORMAL HIGH (ref 15–41)
Albumin: 3.9 g/dL (ref 3.5–5.0)
Alkaline Phosphatase: 94 U/L (ref 38–126)
Anion gap: 9 (ref 5–15)
BUN: 12 mg/dL (ref 6–20)
CO2: 23 mmol/L (ref 22–32)
Calcium: 8.5 mg/dL — ABNORMAL LOW (ref 8.9–10.3)
Chloride: 104 mmol/L (ref 98–111)
Creatinine, Ser: 0.63 mg/dL (ref 0.61–1.24)
GFR calc Af Amer: >60 mL/min (ref >60)
GFR calc non Af Amer: >60 mL/min (ref >60)
Glucose, Bld: 89 mg/dL (ref 70–99)
Potassium: 3.6 mmol/L (ref 3.5–5.1)
Sodium: 136 mmol/L (ref 135–145)
Total Bilirubin: 1.4 mg/dL — ABNORMAL HIGH (ref 0.3–1.2)
Total Protein: 7.3 g/dL (ref 6.5–8.1)

## 2019-07-10 LAB — CK: Total CK: 111 U/L (ref 49–397)

## 2019-07-10 LAB — CBC
HCT: 41 % (ref 39.0–52.0)
Hemoglobin: 13.2 g/dL (ref 13.0–17.0)
MCH: 27.7 pg (ref 26.0–34.0)
MCHC: 32.2 g/dL (ref 30.0–36.0)
MCV: 86.1 fL (ref 80.0–100.0)
Platelets: 255 10*3/uL (ref 150–400)
RBC: 4.76 MIL/uL (ref 4.22–5.81)
RDW: 13.7 % (ref 11.5–15.5)
WBC: 14.9 10*3/uL — ABNORMAL HIGH (ref 4.0–10.5)
nRBC: 0 % (ref 0.0–0.2)

## 2019-07-10 LAB — ETHANOL: Alcohol, Ethyl (B): <10 mg/dL (ref <10)

## 2019-07-10 MED ORDER — SODIUM CHLORIDE 0.9 % IV BOLUS
1000.0000 mL | Freq: Once | INTRAVENOUS | Status: AC
  Administered 2019-07-10: 16:00:00 1000 mL via INTRAVENOUS

## 2019-07-10 MED ORDER — ONDANSETRON HCL 4 MG/2ML IJ SOLN
4.0000 mg | Freq: Once | INTRAMUSCULAR | Status: AC
  Administered 2019-07-10: 16:00:00 4 mg via INTRAVENOUS
  Filled 2019-07-10: qty 2

## 2019-07-10 NOTE — ED Notes (Signed)
Pt snoring.  nsr on monitor  Iv in place.
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
## 2019-07-10 NOTE — ED Notes (Signed)
Pt awake and alert.  md aware.  Pt denies SI or HI.  Pt states he used heroin and ghb today.  Pt does not want detox.  Pt calm and cooperative.

## 2019-07-10 NOTE — ED Notes (Signed)
Pt still sleeping easily aroused.  nsr on monitor.

## 2019-07-10 NOTE — ED Triage Notes (Addendum)
Pt brought in via ems from a drug house.  Pt unresponsive on arrival.  Ems report pt used GHB.  Needle/syringe found in pt's clothing.  Urinary incontinence on arrival  md at bedside.  nsr on monitor.

## 2019-07-10 NOTE — ED Notes (Signed)
Pt brought in via ems from a drug house.  Pt unresponsive on arrival  md at bedside.   Pt arousable with stimulation.  nsr on monitor.  Iv in place.  siderails up x 2.  Pt nonverbal at this time but moving about on bed.

## 2019-07-10 NOTE — ED Notes (Signed)
Pt states he wants to go  md aware.  Pt did not want to wait.  Iv removed and pt left without signing for discharge instructions.   md aware.
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
## 2020-03-08 ENCOUNTER — Other Ambulatory Visit: Payer: Self-pay

## 2020-03-08 ENCOUNTER — Emergency Department (HOSPITAL_COMMUNITY)
Admission: EM | Admit: 2020-03-08 | Discharge: 2020-03-09 | Disposition: A | Discharge status: Home / Self Care | Payer: Self-pay | Attending: Emergency Medicine | Admitting: Emergency Medicine

## 2020-03-08 ENCOUNTER — Encounter (HOSPITAL_COMMUNITY): Payer: Self-pay

## 2020-03-08 DIAGNOSIS — Z20822 Contact with and (suspected) exposure to covid-19: Secondary | ICD-10-CM | POA: Insufficient documentation

## 2020-03-08 DIAGNOSIS — F1910 Other psychoactive substance abuse, uncomplicated: Secondary | ICD-10-CM

## 2020-03-08 DIAGNOSIS — F17210 Nicotine dependence, cigarettes, uncomplicated: Secondary | ICD-10-CM | POA: Insufficient documentation

## 2020-03-08 DIAGNOSIS — F32A Depression, unspecified: Secondary | ICD-10-CM

## 2020-03-08 DIAGNOSIS — Z79899 Other long term (current) drug therapy: Secondary | ICD-10-CM | POA: Insufficient documentation

## 2020-03-08 DIAGNOSIS — F333 Major depressive disorder, recurrent, severe with psychotic symptoms: Secondary | ICD-10-CM | POA: Insufficient documentation

## 2020-03-08 DIAGNOSIS — F329 Major depressive disorder, single episode, unspecified: Secondary | ICD-10-CM

## 2020-03-08 DIAGNOSIS — F19951 Other psychoactive substance use, unspecified with psychoactive substance-induced psychotic disorder with hallucinations: Secondary | ICD-10-CM

## 2020-03-08 LAB — CBC WITH DIFFERENTIAL/PLATELET
Abs Immature Granulocytes: 0.13 10*3/uL — ABNORMAL HIGH (ref 0.00–0.07)
Basophils Absolute: 0.1 10*3/uL (ref 0.0–0.1)
Basophils Relative: 1 %
Eosinophils Absolute: 0.2 10*3/uL (ref 0.0–0.5)
Eosinophils Relative: 2 %
HCT: 37.2 % — ABNORMAL LOW (ref 39.0–52.0)
Hemoglobin: 11.8 g/dL — ABNORMAL LOW (ref 13.0–17.0)
Immature Granulocytes: 1 %
Lymphocytes Relative: 40 %
Lymphs Abs: 3.6 10*3/uL (ref 0.7–4.0)
MCH: 29.3 pg (ref 26.0–34.0)
MCHC: 31.7 g/dL (ref 30.0–36.0)
MCV: 92.3 fL (ref 80.0–100.0)
Monocytes Absolute: 0.6 10*3/uL (ref 0.1–1.0)
Monocytes Relative: 7 %
Neutro Abs: 4.5 10*3/uL (ref 1.7–7.7)
Neutrophils Relative %: 49 %
Platelets: 301 10*3/uL (ref 150–400)
RBC: 4.03 MIL/uL — ABNORMAL LOW (ref 4.22–5.81)
RDW: 14.4 % (ref 11.5–15.5)
WBC: 9.1 10*3/uL (ref 4.0–10.5)
nRBC: 0 % (ref 0.0–0.2)

## 2020-03-08 LAB — BASIC METABOLIC PANEL
Anion gap: 8 (ref 5–15)
BUN: 15 mg/dL (ref 6–20)
CO2: 25 mmol/L (ref 22–32)
Calcium: 8.8 mg/dL — ABNORMAL LOW (ref 8.9–10.3)
Chloride: 105 mmol/L (ref 98–111)
Creatinine, Ser: 0.83 mg/dL (ref 0.61–1.24)
GFR calc Af Amer: >60 mL/min (ref >60)
GFR calc non Af Amer: >60 mL/min (ref >60)
Glucose, Bld: 115 mg/dL — ABNORMAL HIGH (ref 70–99)
Potassium: 4.3 mmol/L (ref 3.5–5.1)
Sodium: 138 mmol/L (ref 135–145)

## 2020-03-08 LAB — RAPID URINE DRUG SCREEN, HOSP PERFORMED
Amphetamines: NOT DETECTED
Barbiturates: NOT DETECTED
Benzodiazepines: NOT DETECTED
Cocaine: NOT DETECTED
Opiates: NOT DETECTED
Tetrahydrocannabinol: NOT DETECTED

## 2020-03-08 LAB — RESPIRATORY PANEL BY RT PCR (FLU A&B, COVID)
Influenza A by PCR: NEGATIVE
Influenza B by PCR: NEGATIVE
SARS Coronavirus 2 by RT PCR: NEGATIVE

## 2020-03-08 LAB — ETHANOL: Alcohol, Ethyl (B): <10 mg/dL (ref <10)

## 2020-03-08 MED ORDER — CLONIDINE HCL 0.1 MG PO TABS
0.10 | ORAL_TABLET | ORAL | Status: DC
Start: ? — End: 2020-03-08

## 2020-03-08 MED ORDER — ALUM & MAG HYDROXIDE-SIMETH 200-200-20 MG/5ML PO SUSP
30.00 | ORAL | Status: DC
Start: ? — End: 2020-03-08

## 2020-03-08 MED ORDER — ACETAMINOPHEN 325 MG PO TABS
650.00 | ORAL_TABLET | ORAL | Status: DC
Start: ? — End: 2020-03-08

## 2020-03-08 MED ORDER — LOPERAMIDE HCL 2 MG PO CAPS
2.00 | ORAL_CAPSULE | ORAL | Status: DC
Start: ? — End: 2020-03-08

## 2020-03-08 MED ORDER — DIPHENHYDRAMINE HCL 50 MG/ML IJ SOLN
50.00 | INTRAMUSCULAR | Status: DC
Start: ? — End: 2020-03-08

## 2020-03-08 MED ORDER — NICOTINE POLACRILEX 2 MG MT GUM
2.00 | CHEWING_GUM | OROMUCOSAL | Status: DC
Start: ? — End: 2020-03-08

## 2020-03-08 MED ORDER — BENZOCAINE-MENTHOL 6-10 MG MT LOZG
1.00 | LOZENGE | OROMUCOSAL | Status: DC
Start: ? — End: 2020-03-08

## 2020-03-08 MED ORDER — QUINTABS PO TABS
1.00 | ORAL_TABLET | ORAL | Status: DC
Start: 2020-03-07 — End: 2020-03-08

## 2020-03-08 MED ORDER — TRAZODONE HCL 50 MG PO TABS
50.00 | ORAL_TABLET | ORAL | Status: DC
Start: ? — End: 2020-03-08

## 2020-03-08 MED ORDER — HALOPERIDOL 5 MG PO TABS
5.00 | ORAL_TABLET | ORAL | Status: DC
Start: ? — End: 2020-03-08

## 2020-03-08 MED ORDER — RISPERIDONE 1 MG PO TABS
1.00 | ORAL_TABLET | ORAL | Status: DC
Start: 2020-03-06 — End: 2020-03-08

## 2020-03-08 MED ORDER — LORAZEPAM 1 MG PO TABS
1.00 | ORAL_TABLET | ORAL | Status: DC
Start: ? — End: 2020-03-08

## 2020-03-08 MED ORDER — BISACODYL 5 MG PO TBEC
10.00 | DELAYED_RELEASE_TABLET | ORAL | Status: DC
Start: ? — End: 2020-03-08

## 2020-03-08 MED ORDER — CETIRIZINE HCL 10 MG PO TABS
10.00 | ORAL_TABLET | ORAL | Status: DC
Start: ? — End: 2020-03-08

## 2020-03-08 MED ORDER — FLUOXETINE HCL 20 MG PO CAPS
20.00 | ORAL_CAPSULE | ORAL | Status: DC
Start: 2020-03-07 — End: 2020-03-08

## 2020-03-08 MED ORDER — GUAIFENESIN 100 MG/5ML PO SYRP
200.00 | ORAL_SOLUTION | ORAL | Status: DC
Start: ? — End: 2020-03-08

## 2020-03-08 MED ORDER — SALINE NASAL SPRAY 0.65 % NA SOLN
1.00 | NASAL | Status: DC
Start: ? — End: 2020-03-08

## 2020-03-08 MED ORDER — HYDROXYZINE HCL 25 MG PO TABS
50.00 | ORAL_TABLET | ORAL | Status: DC
Start: ? — End: 2020-03-08

## 2020-03-08 MED ORDER — FLUOXETINE HCL 20 MG PO CAPS
20.0000 mg | ORAL_CAPSULE | Freq: Every day | ORAL | Status: DC
  Administered 2020-03-08 – 2020-03-09 (×2): 20 mg via ORAL
  Filled 2020-03-08 (×3): qty 1

## 2020-03-08 MED ORDER — DIPHENHYDRAMINE HCL 25 MG PO CAPS
50.00 | ORAL_CAPSULE | ORAL | Status: DC
Start: ? — End: 2020-03-08

## 2020-03-08 MED ORDER — LORAZEPAM 2 MG/ML IJ SOLN
2.00 | INTRAMUSCULAR | Status: DC
Start: ? — End: 2020-03-08

## 2020-03-08 MED ORDER — NICOTINE 14 MG/24HR TD PT24
1.00 | MEDICATED_PATCH | TRANSDERMAL | Status: DC
Start: 2020-03-07 — End: 2020-03-08

## 2020-03-08 MED ORDER — RISPERIDONE 1 MG PO TABS
1.0000 mg | ORAL_TABLET | Freq: Two times a day (BID) | ORAL | Status: DC
  Administered 2020-03-08 – 2020-03-09 (×3): 1 mg via ORAL
  Filled 2020-03-08 (×4): qty 1

## 2020-03-08 MED ORDER — ONDANSETRON 8 MG PO TBDP
8.00 | ORAL_TABLET | ORAL | Status: DC
Start: ? — End: 2020-03-08

## 2020-03-08 MED ORDER — GENERIC EXTERNAL MEDICATION
5.00 | Status: DC
Start: ? — End: 2020-03-08
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
## 2020-03-08 NOTE — ED Triage Notes (Signed)
Pt requesting detox from heroin, last use was yesterday. States "they done found me dead 3 times". Pt tearful in triage.

## 2020-03-09 NOTE — ED Notes (Signed)
D/C instructions given and questions answered to satisfaction - ALL Belongings - 2 labeled belongings bags - No Valuables Envelope - noted. Pt signed verifying all items present.

## 2020-03-09 NOTE — Patient Outreach (Signed)
[facility] met with the patient in order to provide substance use recovery support and provide help with getting connected to substance use treatment resources. Patient reports a history of IV heroin and methamphetamine use. Patient reports no prior substance use treatment history within the past year. Patient states that his last residential substance use treatment admission was 2 years ago at [facility]. Patient is interested in the [facility] which currently has men's treatment beds. [facility]PSS is in the process of setting up an admission to the [facility] for the patient. [facility]PSS also provided information for other substance use recovery resources including residential/outpatient substance use treatment center list, [place] online/in-person NA meeting list, [place] vacancy list/flier detailing [employer], and [facility]PSS contact information. [facility]PSS strongly encouraged the patient to continue to stay in contact with [facility] for further help with [facility] substance use recovery outreach services. Patient is passionate about his substance use recovery process and thanked [facility] for the [facility] services provided.

## 2020-03-09 NOTE — ED Notes (Signed)
Sitting on bed watching tv. Voices agreement w/tx plan - D/C to [facility] - Peer Support assisting.

## 2020-03-09 NOTE — ED Notes (Signed)
Spoke w/Meredith and Maralyn Sago at [facility] - inquiring about Peer-to-Peer Support consult.

## 2020-03-09 NOTE — ED Notes (Signed)
Pt ambulated to bathroom and back to room w/o difficulty. Sprite given as requested.
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
## 2020-03-09 NOTE — ED Notes (Signed)
Telepsych completed.  

## 2020-03-09 NOTE — ED Notes (Signed)
Peer-to-Peer Support in w/pt.

## 2020-03-09 NOTE — ED Provider Notes (Signed)
2:15 PM I was asked by nursing staff in order to discharge patient.  Patient has been seen by peers support, did get a consult with social work in order to have outpatient placement at a rehab facility.  Evaluation of patient he is resting comfortably in bed, voices no complaints at this time, vitals are within normal limits. Patient is stable for discharge.    Portions of this note were generated with Scientist, clinical (histocompatibility and immunogenetics). Dictation errors may occur despite best attempts at proofreading.     Claude Manges, PA-C 03/09/20 1416    Melene Plan, DO 03/09/20 1458

## 2020-03-09 NOTE — ED Notes (Signed)
Pt woke - ate breakfast and ambulated to bathroom w/o difficulty.

## 2020-03-09 NOTE — ED Notes (Signed)
Vol./SI-overnight obs  Breakfast ordered

## 2020-11-21 ENCOUNTER — Other Ambulatory Visit: Payer: Self-pay

## 2020-11-21 ENCOUNTER — Emergency Department
Admission: EM | Admit: 2020-11-21 | Discharge: 2020-11-22 | Disposition: A | Discharge status: Home / Self Care | Payer: Medicaid Other | Attending: Emergency Medicine | Admitting: Emergency Medicine

## 2020-11-21 DIAGNOSIS — F1910 Other psychoactive substance abuse, uncomplicated: Secondary | ICD-10-CM | POA: Insufficient documentation

## 2020-11-21 DIAGNOSIS — F17210 Nicotine dependence, cigarettes, uncomplicated: Secondary | ICD-10-CM | POA: Insufficient documentation

## 2020-11-21 DIAGNOSIS — Z139 Encounter for screening, unspecified: Secondary | ICD-10-CM

## 2020-11-21 DIAGNOSIS — Z79899 Other long term (current) drug therapy: Secondary | ICD-10-CM | POA: Insufficient documentation

## 2020-11-21 DIAGNOSIS — E876 Hypokalemia: Secondary | ICD-10-CM | POA: Insufficient documentation

## 2020-11-21 LAB — URINE DRUG SCREEN, QUALITATIVE (ARMC ONLY)
Amphetamines, Ur Screen: POSITIVE — AB
Barbiturates, Ur Screen: NOT DETECTED
Benzodiazepine, Ur Scrn: NOT DETECTED
Cannabinoid 50 Ng, Ur ~~LOC~~: POSITIVE — AB
Cocaine Metabolite,Ur ~~LOC~~: NOT DETECTED
MDMA (Ecstasy)Ur Screen: NOT DETECTED
Methadone Scn, Ur: POSITIVE — AB
Opiate, Ur Screen: NOT DETECTED
Phencyclidine (PCP) Ur S: NOT DETECTED

## 2020-11-21 LAB — ETHANOL: Alcohol, Ethyl (B): <10 mg/dL (ref <10)

## 2020-11-21 LAB — COMPREHENSIVE METABOLIC PANEL
ALT: 69 U/L — ABNORMAL HIGH (ref 0–44)
AST: 85 U/L — ABNORMAL HIGH (ref 15–41)
Albumin: 4.6 g/dL (ref 3.5–5.0)
Alkaline Phosphatase: 66 U/L (ref 38–126)
Anion gap: 9 (ref 5–15)
BUN: 15 mg/dL (ref 6–20)
CO2: 25 mmol/L (ref 22–32)
Calcium: 8.8 mg/dL — ABNORMAL LOW (ref 8.9–10.3)
Chloride: 102 mmol/L (ref 98–111)
Creatinine, Ser: 0.72 mg/dL (ref 0.61–1.24)
GFR, Estimated: >60 mL/min (ref >60)
Glucose, Bld: 155 mg/dL — ABNORMAL HIGH (ref 70–99)
Potassium: 3.2 mmol/L — ABNORMAL LOW (ref 3.5–5.1)
Sodium: 136 mmol/L (ref 135–145)
Total Bilirubin: 1.3 mg/dL — ABNORMAL HIGH (ref 0.3–1.2)
Total Protein: 7.5 g/dL (ref 6.5–8.1)

## 2020-11-21 LAB — CBC
HCT: 36.8 % — ABNORMAL LOW (ref 39.0–52.0)
Hemoglobin: 12.8 g/dL — ABNORMAL LOW (ref 13.0–17.0)
MCH: 30 pg (ref 26.0–34.0)
MCHC: 34.8 g/dL (ref 30.0–36.0)
MCV: 86.4 fL (ref 80.0–100.0)
Platelets: 268 10*3/uL (ref 150–400)
RBC: 4.26 MIL/uL (ref 4.22–5.81)
RDW: 14 % (ref 11.5–15.5)
WBC: 8.5 10*3/uL (ref 4.0–10.5)
nRBC: 0 % (ref 0.0–0.2)

## 2020-11-21 LAB — SALICYLATE LEVEL: Salicylate Lvl: <7 mg/dL — ABNORMAL LOW (ref 7.0–30.0)

## 2020-11-21 LAB — ACETAMINOPHEN LEVEL: Acetaminophen (Tylenol), Serum: <10 ug/mL — ABNORMAL LOW (ref 10–30)

## 2020-11-21 NOTE — ED Notes (Addendum)
Nurse found patient hold aleve pills in his hands. This Clinical research associate took 6 aleve pills from the patient. And told the patient that per hospital policy patient can't have their medications with them. Patient was okay with it.

## 2020-11-21 NOTE — ED Notes (Signed)
Hourly rounding reveals patient in room. No complaints, stable, in no acute distress. Q15 minute rounds and monitoring via Rover and Officer to continue.   

## 2020-11-21 NOTE — ED Notes (Addendum)
Hourly rounding reveals patient in room. No complaints, stable, in no acute distress. Q15 minute rounds and monitoring via Rover and Officer to continue.   

## 2020-11-21 NOTE — ED Triage Notes (Signed)
Pt was sent here by [facility] for medical clearance. 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 Denies any SI/HI.

## 2020-11-21 NOTE — ED Notes (Signed)
Patient ready for discharge. This Clinical research associate told patient that he is medically cleared by the EDP and that he can go to [facility] for detox. Patient said he needs a transportation to go to [facility]. Charge nurse notified.

## 2020-11-21 NOTE — ED Notes (Signed)
Snack and beverage given. 

## 2020-11-21 NOTE — Discharge Instructions (Signed)
No medical reasons why patient cannot be and drug rehab.  Return to the ED with any concerns for acute illness.
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
## 2020-11-21 NOTE — ED Notes (Signed)
Report from Amy RN. Patient sleeping, respirations regular and unlabored. Q15 minute rounds and observation by Rover and Officer to continue.  

## 2020-11-21 NOTE — ED Notes (Signed)
Hourly rounding reveals patient in room. No complaints, stable, in no acute distress. Q15 minute rounds and monitoring via Security Cameras to continue. 

## 2020-11-21 NOTE — ED Notes (Signed)
This Clinical research associate called [facility] to check if they can take the patient tonight per charge nurse. No answer. Will try again

## 2020-11-22 NOTE — ED Notes (Signed)
Called [facility] and left message. Will try again shortly.

## 2020-11-22 NOTE — ED Notes (Signed)
Rider waiver signed by pt at this time.

## 2020-11-22 NOTE — ED Provider Notes (Signed)
-----------------------------------------   6:34 AM on 11/22/2020 -----------------------------------------  Patient remained in the ED overnight; awaiting confirmation patient has a bed at [facility] then will be transported.   Irean Hong, MD 11/22/20 [phone]

## 2020-11-22 NOTE — ED Notes (Signed)
Pt given a cup with gingerale

## 2020-11-22 NOTE — ED Notes (Signed)
Breakfast tray was given with juice. 

## 2020-11-22 NOTE — ED Notes (Signed)
Pt ambulatory to bathroom, noted with steady gate at this time

## 2020-11-22 NOTE — ED Notes (Signed)
Pt verbalized understanding of d/c instructions at this time. Pt denies further questions at this time   Pt ambulated to lobby, tolerated well at this time, and assisted into [facility] vehicle at this time.

## 2020-11-22 NOTE — ED Notes (Addendum)
Called Candy at [facility]([phone]) and provided information regarding pt wanting detox. Per Candy pt would be considered a walk in and could be transported there. Pt informed and understands. Pt states he still wants to go. [facility] transport called at this time. Pt updated.

## 2020-11-22 NOTE — ED Notes (Signed)
Attempted to call [facility] at this time. Unable to reach admission staff at this time, left message at this time. Will re-attempt at later time.

## 2022-11-02 ENCOUNTER — Inpatient Hospital Stay: Admit: 2022-11-02 | Discharge: 2022-11-02 | Payer: PRIVATE HEALTH INSURANCE | Source: Home / Self Care

## 2022-11-02 DIAGNOSIS — S80811A Abrasion, right lower leg, initial encounter: Secondary | ICD-10-CM

## 2022-11-02 DIAGNOSIS — L03115 Cellulitis of right lower limb: Secondary | ICD-10-CM

## 2022-11-02 DIAGNOSIS — S91116A Laceration without foreign body of unspecified lesser toe(s) without damage to nail, initial encounter: Secondary | ICD-10-CM

## 2022-11-02 MED ORDER — CEPHALEXIN 500 MG PO CAPS
500 mg | ORAL_CAPSULE | Freq: Three times a day (TID) | ORAL | 0 refills | 10.00 days | Status: AC
Start: 2022-11-02 — End: ?
  Filled 2022-11-03: qty 30, 10d supply, fill #0

## 2022-11-02 MED ORDER — SULFAMETHOXAZOLE-TRIMETHOPRIM DOUBLE STRENGTH 800-160 MG PO TABS
1 | ORAL_TABLET | Freq: Two times a day (BID) | ORAL | 0 refills | 10.00 days | Status: AC
Start: 2022-11-02 — End: ?
  Filled 2022-11-03: qty 20, 10d supply, fill #0

## 2022-11-02 MED ADMIN — SULFAMETHOXAZOLE-TRIMETHOPRIM DOUBLE STRENGTH 800-160 MG PO TABS: 1 | ORAL | @ 23:00:00 | Stop: 2022-11-03

## 2022-11-02 MED ADMIN — TETANUS-DIPHTH-ACELL PERTUSSIS 5-2.5-18.5 LF-MCG/0.5 IM SUSY: .5 mL | INTRAMUSCULAR | @ 23:00:00 | Stop: 2022-11-03

## 2022-11-02 MED ADMIN — CEPHALEXIN 250 MG PO CAPS: 500 mg | ORAL | @ 23:00:00 | Stop: 2022-11-03

## 2022-11-02 NOTE — ED Notes
The patient is flagged as homeless. Patient was referred to the [facility] per policy [id number].    Dent met with the patient, introduced myself/role, and discussed homeless resources. Pt expressed unsatisfied with the medication given and is requesting to be transferred to the next nearby hospital. Pt was informed we could not transport him but could provide a bus pass.     Pt was offered shelter and homeless resources. Pt accepted and requested resources in [place]. Pt reports he does not want to be in the [facility] area. Pt was given resources, two bus passes, and denied the need for clean clothing.    Pt reports being homeless in [place] for two months and is originally from [place]. Pt reports he does not have a mailing address or an emergency contact. Pt has [facility] and no PCP. Pt was provided resources to [facility] and [facility].      Pt eloped before providing food and signing the homeless checklist.

## 2022-11-02 NOTE — ED Notes
This RN received report on this pt, pt eloped before this RN met pt as per previous Therapist, sports. Provider made aware.

## 2022-11-02 NOTE — ED Notes
Patient walked out before RN was able to medicate/complete wound care.
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
## 2022-11-02 NOTE — ED Notes
Patient placed in SM15, per MD PO abx and DC. Patient requesting IV abx. Asked to speak with MD.
